# Patient Record
Sex: Female | Born: 1985 | Race: Black or African American | Hispanic: No | Marital: Single | Smoking: Never smoker
Health system: Southern US, Community
[De-identification: ages and names within clinical notes are randomized; demographics above are authoritative.]

## PROBLEM LIST (undated history)

## (undated) ENCOUNTER — Emergency Department (HOSPITAL_BASED_OUTPATIENT_CLINIC_OR_DEPARTMENT_OTHER): Admission: EM | Payer: No Typology Code available for payment source | Source: Home / Self Care

## (undated) DIAGNOSIS — K5900 Constipation, unspecified: Secondary | ICD-10-CM

## (undated) DIAGNOSIS — F429 Obsessive-compulsive disorder, unspecified: Secondary | ICD-10-CM

## (undated) DIAGNOSIS — R002 Palpitations: Secondary | ICD-10-CM

## (undated) DIAGNOSIS — F4310 Post-traumatic stress disorder, unspecified: Secondary | ICD-10-CM

## (undated) HISTORY — PX: WISDOM TOOTH EXTRACTION: SHX21

---
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
## 2008-06-06 ENCOUNTER — Emergency Department (HOSPITAL_COMMUNITY): Admission: EM | Admit: 2008-06-06 | Discharge: 2008-06-06 | Payer: Self-pay | Admitting: Emergency Medicine

## 2012-08-31 ENCOUNTER — Encounter (HOSPITAL_COMMUNITY): Payer: Self-pay

## 2012-08-31 ENCOUNTER — Emergency Department (HOSPITAL_COMMUNITY)
Admission: EM | Admit: 2012-08-31 | Discharge: 2012-09-01 | Disposition: A | Discharge status: Home / Self Care | Payer: Self-pay | Attending: Emergency Medicine | Admitting: Emergency Medicine

## 2012-08-31 DIAGNOSIS — Z79899 Other long term (current) drug therapy: Secondary | ICD-10-CM | POA: Insufficient documentation

## 2012-08-31 DIAGNOSIS — F29 Unspecified psychosis not due to a substance or known physiological condition: Secondary | ICD-10-CM

## 2012-08-31 DIAGNOSIS — Z8659 Personal history of other mental and behavioral disorders: Secondary | ICD-10-CM | POA: Insufficient documentation

## 2012-08-31 HISTORY — DX: Obsessive-compulsive disorder, unspecified: F42.9

## 2012-08-31 HISTORY — DX: Post-traumatic stress disorder, unspecified: F43.10

## 2012-08-31 LAB — COMPREHENSIVE METABOLIC PANEL
ALT: 9 U/L (ref 0–35)
AST: 18 U/L (ref 0–37)
Albumin: 4.2 g/dL (ref 3.5–5.2)
Alkaline Phosphatase: 44 U/L (ref 39–117)
BUN: 13 mg/dL (ref 6–23)
CO2: 24 mEq/L (ref 19–32)
Calcium: 9.8 mg/dL (ref 8.4–10.5)
Chloride: 100 mEq/L (ref 96–112)
Creatinine, Ser: 0.7 mg/dL (ref 0.50–1.10)
GFR calc Af Amer: >90 mL/min (ref >90)
GFR calc non Af Amer: >90 mL/min (ref >90)
Glucose, Bld: 104 mg/dL — ABNORMAL HIGH (ref 70–99)
Potassium: 4.2 mEq/L (ref 3.5–5.1)
Sodium: 135 mEq/L (ref 135–145)
Total Bilirubin: 1.2 mg/dL (ref 0.3–1.2)
Total Protein: 7.4 g/dL (ref 6.0–8.3)

## 2012-08-31 LAB — CBC WITH DIFFERENTIAL/PLATELET
Basophils Absolute: 0 10*3/uL (ref 0.0–0.1)
Basophils Relative: 1 % (ref 0–1)
Eosinophils Absolute: 0 10*3/uL (ref 0.0–0.7)
Eosinophils Relative: 0 % (ref 0–5)
HCT: 39.6 % (ref 36.0–46.0)
Hemoglobin: 13.6 g/dL (ref 12.0–15.0)
Lymphocytes Relative: 19 % (ref 12–46)
Lymphs Abs: 1.1 10*3/uL (ref 0.7–4.0)
MCH: 31.6 pg (ref 26.0–34.0)
MCHC: 34.3 g/dL (ref 30.0–36.0)
MCV: 92.1 fL (ref 78.0–100.0)
Monocytes Absolute: 0.5 10*3/uL (ref 0.1–1.0)
Monocytes Relative: 8 % (ref 3–12)
Neutro Abs: 4.3 10*3/uL (ref 1.7–7.7)
Neutrophils Relative %: 72 % (ref 43–77)
Platelets: 222 10*3/uL (ref 150–400)
RBC: 4.3 MIL/uL (ref 3.87–5.11)
RDW: 12.8 % (ref 11.5–15.5)
WBC: 5.9 10*3/uL (ref 4.0–10.5)

## 2012-08-31 LAB — RAPID URINE DRUG SCREEN, HOSP PERFORMED
Amphetamines: NOT DETECTED
Barbiturates: NOT DETECTED
Benzodiazepines: POSITIVE — AB
Cocaine: NOT DETECTED
Opiates: NOT DETECTED
Tetrahydrocannabinol: NOT DETECTED

## 2012-08-31 LAB — ETHANOL: Alcohol, Ethyl (B): <11 mg/dL (ref 0–11)

## 2012-08-31 MED ORDER — ACETAMINOPHEN 325 MG PO TABS: 650.0000 mg | ORAL_TABLET | ORAL | Status: DC | PRN

## 2012-08-31 MED ORDER — ALUM & MAG HYDROXIDE-SIMETH 200-200-20 MG/5ML PO SUSP: 30.0000 mL | ORAL | Status: DC | PRN

## 2012-08-31 MED ORDER — IBUPROFEN 600 MG PO TABS: 600.0000 mg | ORAL_TABLET | Freq: Three times a day (TID) | ORAL | Status: DC | PRN

## 2012-08-31 MED ORDER — ONDANSETRON HCL 4 MG PO TABS: 4.0000 mg | ORAL_TABLET | Freq: Three times a day (TID) | ORAL | Status: DC | PRN

## 2012-08-31 MED ORDER — LORAZEPAM 1 MG PO TABS: 1.0000 mg | ORAL_TABLET | Freq: Three times a day (TID) | ORAL | Status: DC | PRN
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
## 2012-08-31 NOTE — ED Notes (Signed)
telepsych consult faxed and called in. Awaiting call from psychiatrist

## 2012-08-31 NOTE — ED Notes (Signed)
Pt is in blue scrubs and has, along w/belongings, been wanded by security.
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
## 2012-09-01 NOTE — BHH Counselor (Signed)
Dr. Lanell Matar telepsych consult recommends discharge. Pt continues to deny to writer any SI or HI. Pt denies [facility] and no delusions noted. Pt requests info re: women's homeless shelters as she doesn't feel safe going back to her mom's house. Writer provides info re: shelters and pt Armed forces training and education officer. Pt reports she will followup with Dr. Evelene Croon as scheduled.  Evette Cristal, [place] Assessment Counselor

## 2012-09-01 NOTE — ED Provider Notes (Signed)
PSY consult  2:52 AM Dr Jacky Kindle evaluated PT and recommends OK for discharge home with outpatient follow up [facility]. Referrals provided. He reversed IVC.   Results for orders placed during the hospital encounter of 08/31/12  CBC WITH DIFFERENTIAL      Result Value Range   WBC 5.9  4.0 - 10.5 K/uL   RBC 4.30  3.87 - 5.11 MIL/uL   Hemoglobin 13.6  12.0 - 15.0 g/dL   HCT 25.9  56.3 - 87.5 %   MCV 92.1  78.0 - 100.0 fL   MCH 31.6  26.0 - 34.0 pg   MCHC 34.3  30.0 - 36.0 g/dL   RDW 64.3  32.9 - 51.8 %   Platelets 222  150 - 400 K/uL   Neutrophils Relative 72  43 - 77 %   Neutro Abs 4.3  1.7 - 7.7 K/uL   Lymphocytes Relative 19  12 - 46 %   Lymphs Abs 1.1  0.7 - 4.0 K/uL   Monocytes Relative 8  3 - 12 %   Monocytes Absolute 0.5  0.1 - 1.0 K/uL   Eosinophils Relative 0  0 - 5 %   Eosinophils Absolute 0.0  0.0 - 0.7 K/uL   Basophils Relative 1  0 - 1 %   Basophils Absolute 0.0  0.0 - 0.1 K/uL  COMPREHENSIVE METABOLIC PANEL      Result Value Range   Sodium 135  135 - 145 mEq/L   Potassium 4.2  3.5 - 5.1 mEq/L   Chloride 100  96 - 112 mEq/L   CO2 24  19 - 32 mEq/L   Glucose, Bld 104 (*) 70 - 99 mg/dL   BUN 13  6 - 23 mg/dL   Creatinine, Ser 8.41  0.50 - 1.10 mg/dL   Calcium 9.8  8.4 - 66.0 mg/dL   Total Protein 7.4  6.0 - 8.3 g/dL   Albumin 4.2  3.5 - 5.2 g/dL   AST 18  0 - 37 U/L   ALT 9  0 - 35 U/L   Alkaline Phosphatase 44  39 - 117 U/L   Total Bilirubin 1.2  0.3 - 1.2 mg/dL   GFR calc non Af Amer >90  >90 mL/min   GFR calc Af Amer >90  >90 mL/min  URINE RAPID DRUG SCREEN (HOSP PERFORMED)      Result Value Range   Opiates NONE DETECTED  NONE DETECTED   Cocaine NONE DETECTED  NONE DETECTED   Benzodiazepines POSITIVE (*) NONE DETECTED   Amphetamines NONE DETECTED  NONE DETECTED   Tetrahydrocannabinol NONE DETECTED  NONE DETECTED   Barbiturates NONE DETECTED  NONE DETECTED  ETHANOL      Result Value Range   Alcohol, Ethyl (B) <11  0 - 11 mg/dL       Sunnie Nielsen, MD 63/01/60 [phone]

## 2012-09-12 ENCOUNTER — Inpatient Hospital Stay (HOSPITAL_COMMUNITY): Payer: Self-pay

## 2012-09-12 ENCOUNTER — Inpatient Hospital Stay (HOSPITAL_COMMUNITY)
Admission: AD | Admit: 2012-09-12 | Discharge: 2012-09-13 | Disposition: A | Discharge status: Home / Self Care | Payer: Self-pay | Source: Ambulatory Visit | Attending: Emergency Medicine | Admitting: Emergency Medicine

## 2012-09-12 ENCOUNTER — Encounter (HOSPITAL_COMMUNITY): Payer: Self-pay | Admitting: *Deleted

## 2012-09-12 DIAGNOSIS — T7421XA Adult sexual abuse, confirmed, initial encounter: Secondary | ICD-10-CM | POA: Insufficient documentation

## 2012-09-12 DIAGNOSIS — Z8659 Personal history of other mental and behavioral disorders: Secondary | ICD-10-CM | POA: Insufficient documentation

## 2012-09-12 DIAGNOSIS — Z91040 Latex allergy status: Secondary | ICD-10-CM | POA: Insufficient documentation

## 2012-09-12 DIAGNOSIS — F411 Generalized anxiety disorder: Secondary | ICD-10-CM | POA: Insufficient documentation

## 2012-09-12 DIAGNOSIS — Z880 Allergy status to penicillin: Secondary | ICD-10-CM | POA: Insufficient documentation

## 2012-09-12 DIAGNOSIS — Z8679 Personal history of other diseases of the circulatory system: Secondary | ICD-10-CM | POA: Insufficient documentation

## 2012-09-12 DIAGNOSIS — F22 Delusional disorders: Secondary | ICD-10-CM | POA: Insufficient documentation

## 2012-09-12 DIAGNOSIS — Z5900 Homelessness unspecified: Secondary | ICD-10-CM | POA: Insufficient documentation

## 2012-09-12 DIAGNOSIS — Z3202 Encounter for pregnancy test, result negative: Secondary | ICD-10-CM | POA: Insufficient documentation

## 2012-09-12 DIAGNOSIS — R569 Unspecified convulsions: Secondary | ICD-10-CM | POA: Insufficient documentation

## 2012-09-12 DIAGNOSIS — Z8719 Personal history of other diseases of the digestive system: Secondary | ICD-10-CM | POA: Insufficient documentation

## 2012-09-12 DIAGNOSIS — K6289 Other specified diseases of anus and rectum: Secondary | ICD-10-CM | POA: Insufficient documentation

## 2012-09-12 DIAGNOSIS — N949 Unspecified condition associated with female genital organs and menstrual cycle: Secondary | ICD-10-CM | POA: Insufficient documentation

## 2012-09-12 DIAGNOSIS — IMO0002 Reserved for concepts with insufficient information to code with codable children: Secondary | ICD-10-CM | POA: Insufficient documentation

## 2012-09-12 HISTORY — DX: Palpitations: R00.2

## 2012-09-12 HISTORY — DX: Constipation, unspecified: K59.00

## 2012-09-12 LAB — URINALYSIS, ROUTINE W REFLEX MICROSCOPIC
Glucose, UA: NEGATIVE mg/dL
Leukocytes, UA: NEGATIVE
Nitrite: NEGATIVE
pH: 6.5 (ref 5.0–8.0)

## 2012-09-12 LAB — POCT I-STAT, CHEM 8
Calcium, Ion: 1.16 mmol/L (ref 1.12–1.23)
Creatinine, Ser: 0.7 mg/dL (ref 0.50–1.10)
Glucose, Bld: 70 mg/dL (ref 70–99)
HCT: 44 % (ref 36.0–46.0)
Hemoglobin: 15 g/dL (ref 12.0–15.0)
Potassium: 3.2 mEq/L — ABNORMAL LOW (ref 3.5–5.1)
TCO2: 29 mmol/L (ref 0–100)

## 2012-09-12 LAB — POCT PREGNANCY, URINE: Preg Test, Ur: NEGATIVE

## 2012-09-12 MED ORDER — LORAZEPAM 1 MG PO TABS
1.0000 mg | ORAL_TABLET | Freq: Three times a day (TID) | ORAL | Status: DC | PRN
  Filled 2012-09-12: qty 1

## 2012-09-12 MED ORDER — ACETAMINOPHEN 325 MG PO TABS: 650.0000 mg | ORAL_TABLET | ORAL | Status: DC | PRN

## 2012-09-12 MED ORDER — ALUM & MAG HYDROXIDE-SIMETH 200-200-20 MG/5ML PO SUSP: 30.0000 mL | ORAL | Status: DC | PRN

## 2012-09-12 MED ORDER — POTASSIUM CHLORIDE CRYS ER 20 MEQ PO TBCR
30.0000 meq | EXTENDED_RELEASE_TABLET | Freq: Two times a day (BID) | ORAL | Status: DC
  Administered 2012-09-12 – 2012-09-13 (×2): 30 meq via ORAL
  Filled 2012-09-12 (×2): qty 2

## 2012-09-12 MED ORDER — ZOLPIDEM TARTRATE 5 MG PO TABS
5.0000 mg | ORAL_TABLET | Freq: Every evening | ORAL | Status: DC | PRN
  Administered 2012-09-13: 5 mg via ORAL
  Filled 2012-09-12 (×2): qty 1

## 2012-09-12 MED ORDER — ONDANSETRON HCL 4 MG PO TABS: 4.0000 mg | ORAL_TABLET | Freq: Three times a day (TID) | ORAL | Status: DC | PRN

## 2012-09-12 MED ORDER — POLYETHYLENE GLYCOL 3350 17 G PO PACK
17.0000 g | PACK | Freq: Every day | ORAL | Status: DC
  Administered 2012-09-13: 17 g via ORAL
  Filled 2012-09-12: qty 1
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
## 2012-09-12 NOTE — ED Notes (Signed)
Labs currently being drawn. X-ray waiting in hallway. Pt changed into paper scrubs
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
## 2012-09-12 NOTE — MAU Provider Note (Signed)
Attestation of Attending Supervision of Advanced Practitioner (PA/CNM/NP): Evaluation and management procedures were performed by the Advanced Practitioner under my supervision and collaboration.  I have reviewed the Advanced Practitioner's note and chart, and I agree with the management and plan.  Vedant Shehadeh, MD, FACOG Attending Obstetrician & Gynecologist Faculty Practice, [facility]  
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
## 2012-09-13 ENCOUNTER — Inpatient Hospital Stay (HOSPITAL_COMMUNITY)
Admission: RE | Admit: 2012-09-13 | Discharge: 2012-09-17 | DRG: 885 | Disposition: A | Discharge status: Home / Self Care | Payer: No Typology Code available for payment source | Source: Intra-hospital | Attending: Psychiatry | Admitting: Psychiatry

## 2012-09-13 ENCOUNTER — Encounter (HOSPITAL_COMMUNITY): Payer: Self-pay | Admitting: Intensive Care

## 2012-09-13 DIAGNOSIS — F22 Delusional disorders: Secondary | ICD-10-CM

## 2012-09-13 DIAGNOSIS — F4310 Post-traumatic stress disorder, unspecified: Secondary | ICD-10-CM | POA: Diagnosis present

## 2012-09-13 DIAGNOSIS — F429 Obsessive-compulsive disorder, unspecified: Secondary | ICD-10-CM | POA: Diagnosis present

## 2012-09-13 LAB — RAPID URINE DRUG SCREEN, HOSP PERFORMED
Barbiturates: NOT DETECTED
Benzodiazepines: POSITIVE — AB

## 2012-09-13 MED ORDER — CHLORDIAZEPOXIDE HCL 25 MG PO CAPS
25.0000 mg | ORAL_CAPSULE | Freq: Four times a day (QID) | ORAL | Status: DC | PRN
  Administered 2012-09-14 – 2012-09-15 (×2): 25 mg via ORAL
  Filled 2012-09-13 (×2): qty 1

## 2012-09-13 MED ORDER — ACETAMINOPHEN 325 MG PO TABS
650.0000 mg | ORAL_TABLET | Freq: Four times a day (QID) | ORAL | Status: DC | PRN
  Administered 2012-09-13 – 2012-09-14 (×2): 650 mg via ORAL

## 2012-09-13 MED ORDER — ALUM & MAG HYDROXIDE-SIMETH 200-200-20 MG/5ML PO SUSP
30.0000 mL | ORAL | Status: DC | PRN
  Administered 2012-09-14 – 2012-09-16 (×4): 30 mL via ORAL

## 2012-09-13 MED ORDER — TRAZODONE HCL 50 MG PO TABS: 50.0000 mg | ORAL_TABLET | Freq: Every evening | ORAL | Status: DC | PRN

## 2012-09-13 MED ORDER — MAGNESIUM HYDROXIDE 400 MG/5ML PO SUSP: 30.0000 mL | Freq: Every day | ORAL | Status: DC | PRN

## 2012-09-13 NOTE — Progress Notes (Signed)
[facility] has seen patient and provided her with a OC application. °

## 2012-09-13 NOTE — BH Assessment (Signed)
[facility] Assessment Progress Note  Per verbal report from Marchia Bond, Assessment Counselor, at change of shift, pt was accepted to [facility] pending availability of a 400 hall bed.  At 12:45 I spoke to [place], RN, Press photographer, and ascertained that Rm 401-1 is now available.  At 12:47 I called Catha Gosselin, LCSWA and notified her.  Doylene Canning, MA Assessment Counselor 09/13/2012 @ 12:52

## 2012-09-13 NOTE — ED Notes (Signed)
Pt up asking for head of bed to be lowered. Gave urine specimen cup.

## 2012-09-13 NOTE — ED Notes (Signed)
Pt allowed to call [facility] [phone] so that she doesn't lose her spot there at the shelter. The lady said she just needs to have documentation when she returns. Pt mentioned she had a court date today.

## 2012-09-13 NOTE — ED Provider Notes (Signed)
Patient has been accepted to [facility]  Rapid, pressured, tangential speech.  BP 117/79  Pulse 102  Temp(Src) 99.4 F (37.4 C) (Oral)  Resp 16  Ht 5\' 4"  (1.626 m)  Wt 92 lb 9.6 oz (42.003 kg)  BMI 15.89 kg/m2  SpO2 100%  LMP 09/03/2012   Glynn Octave, MD 09/13/12 1313

## 2012-09-13 NOTE — ED Notes (Signed)
Pt was asking to leave immediately. Talked to EDP [place], we decided to give pt ambien as she requested prior to asking to discharge and let her be evaluated by Dr Henrene Hawking in the morning as she's accepted at [facility] pending a 400 hall bed. Pt took [place] and is agreeable to be evaluated by Dr Henrene Hawking in the morning.

## 2012-09-13 NOTE — ED Notes (Signed)
Pt up asking writer to come observe her jerking movements to tell her if they are seizures or what they are, advised writer is not qualified to make that determination or diagnosis but had just been looking at pt on monitor and didn't see any movement from her at all. Pt said it happens really quick. Enlarged her room and will monitor her closely

## 2012-09-13 NOTE — Progress Notes (Signed)
CSW met with pt to complete support paperwork. Patient states she doesn't feel she needs to be commited. CSW and pt discussed that at this time, pt has the choice to voluntarily be admitted to [facility] as recommended. Patient states that she would like to speak with her psychiatrist office, [facility]. Pt continues to discuss delusions with a chip implanted in her. Patient has pressured speech while talking with csw and doctors office. Patient agreed to go to [facility] voluntarily.   Catha Gosselin, LCSWA  [phone] .09/13/2012 1433pm

## 2012-09-13 NOTE — ED Notes (Signed)
Chlordiazepoxide #187 count secured in pharmacy even though bottle says count should be 180 and pt states she took 3 earlier today.

## 2012-09-13 NOTE — Progress Notes (Addendum)
Addendum: Patient accepted to 401-1 to Dr. Jannifer Franklin.   CSW spoke with Elijah Birk at [facility] who stated patient has been accepted pending bed availability on 400 hall.   Catha Gosselin, LCSWA  [phone] .09/13/2012 12:45pm
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
## 2012-09-13 NOTE — ED Notes (Signed)
Amy from the lab called to report result of rapid HIV test. Result is non-reactive.
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
## 2012-09-13 NOTE — Progress Notes (Signed)
Adult Psychoeducational Group Note  Date:  09/13/2012 Time:  9:03 PM  Group Topic/Focus:  Wrap-Up Group:   The focus of this group is to help patients review their daily goal of treatment and discuss progress on daily workbooks.  Participation Level:  Active  Participation Quality:  Redirectable  Affect:  Appropriate  Cognitive:  Alert  Insight: Appropriate  Engagement in Group:  Engaged  Modes of Intervention:  Discussion  Additional Comments:  Pt is new to the unit and talked about what brought her here. She is here because her mom wants her to be here. Pt is mad at her mom because she will not allow her to be an individual. Pt had to be redirected during group for always interjecting when others were talking.  Kaleen Odea R 09/13/2012, 9:03 PM

## 2012-09-14 DIAGNOSIS — F29 Unspecified psychosis not due to a substance or known physiological condition: Secondary | ICD-10-CM

## 2012-09-14 DIAGNOSIS — F4310 Post-traumatic stress disorder, unspecified: Secondary | ICD-10-CM

## 2012-09-14 MED ORDER — RISPERIDONE 0.5 MG PO TABS
0.5000 mg | ORAL_TABLET | Freq: Every day | ORAL | Status: DC
  Filled 2012-09-14 (×4): qty 1

## 2012-09-14 MED ORDER — DOCUSATE SODIUM 100 MG PO CAPS
100.0000 mg | ORAL_CAPSULE | Freq: Every day | ORAL | Status: DC
  Administered 2012-09-14 – 2012-09-15 (×2): 100 mg via ORAL
  Filled 2012-09-14 (×6): qty 1
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
## 2012-09-14 NOTE — [facility] Suicide Risk Assessment (Signed)
Suicide Risk Assessment  Admission Assessment     Nursing information obtained from:    Demographic factors:    Current Mental Status:    Loss Factors:    Historical Factors:    Risk Reduction Factors:     CLINICAL FACTORS:   Currently Psychotic  COGNITIVE FEATURES THAT CONTRIBUTE TO RISK:  Closed-mindedness Loss of executive function Polarized thinking Thought constriction (tunnel vision)    SUICIDE RISK:   Mild:  Suicidal ideation of limited frequency, intensity, duration, and specificity.  There are no identifiable plans, no associated intent, mild dysphoria and related symptoms, good self-control (both objective and subjective assessment), few other risk factors, and identifiable protective factors, including available and accessible social support.  PLAN OF CARE:  I certify that inpatient services furnished can reasonably be expected to improve the patient's condition.  Lylee Corrow T. 09/14/2012, 11:06 AM

## 2012-09-14 NOTE — Progress Notes (Signed)
The focus of this group is to help patients review their daily goal of treatment and discuss progress on daily workbooks. Pt attended the evening group session and responded to all discussion prompts. Pt reported having a good day, the highlight of which was being able to go to the courtyard to play basketball and exercise. Pt stated this was ordinarily her main way to relieve tension and stress and that her time outside today did both of these things. Pt reported feeling better mentally and physically after having gone to the courtyard. Pt also reported being ready for discharge so that she could find a job, get an apartment and work towards having a car. Pt's affect was bright and her speech rapid.

## 2012-09-14 NOTE — Progress Notes (Signed)
Patient ID: Taylor Sandoval, female   DOB: Nov 21, 1985, 27 y.o.   MRN: [id number] 09-14-12 @ 1603 nursing shift note: D: pt denied any SI/hi. She remains delusional and had several question regarding procedures on the unit. She has some general body ache and anxiety this am. A: RN answered all her concerns regarding procedures on the unit. She was administered  Librium prn for the anxiety and tylenol for the general body ache. R: the anxiety was decreased and the body ache was decreased to a 2. On her inventory sheet she wrote she slept well, appetite good, energy normal, attention good with her stating "she is not depressed at all" and "hopeful". W/d symptoms is n/a. She denies any SI. After discharge she plans to have "better nutrition, continue my exercising, enjoying life, staying happy". RN will monitor and Q 15 min ck's continue.

## 2012-09-14 NOTE — Progress Notes (Signed)
Patient ID: Taylor Sandoval, female   DOB: 08/15/1985, 27 y.o.   MRN: [id number]  D: Pt denies SI/HI/AVH. Pt is pleasant and cooperative. Pt denied Risperdal, " I don't need it". Pt stat that she feels great, " I went outside, and had a good time. Pt continues to be delusional, and is exhibiting  A: Pt was offered support and encouragement. Pt was given scheduled medications. Pt was encourage to attend groups. Q 15 minute checks were done for safety.   R:Pt attends groups and interacts well with peers and staff. Pt is taking medication. Pt has no complaints at this time.Pt not  receptive to treatment, but safety maintained on unit.
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
## 2012-09-14 NOTE — Progress Notes (Signed)
Patient ID: Taylor Sandoval, female   DOB: 1985/07/12, 27 y.o.   MRN: [id number] Psychoeducational Group Note  Date:  09/14/2012 Time:1000am  Group Topic/Focus:  Identifying Needs:   The focus of this group is to help patients identify their personal needs that have been historically problematic and identify healthy behaviors to address their needs.  Participation Level:  Active  Participation Quality:  Appropriate  Affect:  Appropriate  Cognitive:  Appropriate  Insight:  Supportive  Engagement in Group:  Supportive  Additional Comments:  Inventory and Psychoeducational group   Valente David 09/14/2012,10:24 AM

## 2012-09-15 NOTE — Progress Notes (Signed)
Patient ID: Taylor Sandoval, female   DOB: 06/23/1985, 27 y.o.   MRN: [id number] Psychoeducational Group Note  Date:  09/15/2012 Time:  1000am  Group Topic/Focus:  Making Healthy Choices:   The focus of this group is to help patients identify negative/unhealthy choices they were using prior to admission and identify positive/healthier coping strategies to replace them upon discharge.  Participation Level:  Active  Participation Quality:  Appropriate  Affect:  Appropriate  Cognitive:  Appropriate  Insight:  Supportive  Engagement in Group:  Supportive  Additional Comments:  Psychoeducational group   Taylor Sandoval 09/15/2012,10:41 AM
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
## 2012-09-15 NOTE — Progress Notes (Signed)
Patient ID: Taylor Sandoval, female   DOB: [birth year], 27 y.o.   MRN: [id number] 09-15-12@ 1136 nursing shift note: 09-15-12 nursing shift note: D: pt remains guarded and paranoid. She denied any si/hi/av presently. She is going to groups and taking her medications. She has not voiced any physical complaints and has had no complaints of pain. During the shift she got a roommate and the roommate had to be moved to another room because she, the roommate,  was so intrusive. Pt thanked staff for accommodating her. RN will monitor and Q 15 min ck's continue.

## 2012-09-15 NOTE — Progress Notes (Signed)
[facility] MD Progress Note  09/15/2012 10:48 AM Taylor Sandoval  MRN:  [id number] Subjective:  I don't have any problem. Objective: Patient seen and chart reviewed.  Patient remains very withdrawn and guarded and appears to be paranoid at times.  She denied her psychotic symptoms.  She denies that she ever threaten her mother.  She refused to start at bedtime.  Today she told that she has given medication from her psychiatrist Dr. Evelene Croon although she has not seen her in 6 weeks.  She told that she has given the diagnosis of OCD but did not mention the details.  She told she was given Prozac and Wellbutrin however she is stopped because of the side effects.  She likes Librium which is helping her sleep and anxiety.  She is not a management problem in the unit.  She is participating in the groups.  She denies any hallucination or any active or passive suicidal thoughts.  She is still very upset on her mother but did not elaborate any homicidal thoughts or plan.  She has knife at home. Diagnosis:   Axis I: Psychotic Disorder NOS Axis II: Deferred Axis III:  Past Medical History  Diagnosis Date  . OCD (obsessive compulsive disorder)   . PTSD (post-traumatic stress disorder)   . Heart palpitations   . Constipation    Axis IV: problems with primary support group Axis V: 51-60 moderate symptoms  ADL's:  Intact  Sleep: Fair  Appetite:  Fair  Suicidal Ideation:  Plan:  None Homicidal Ideation:  Plan:  none but still very upset on her mother. Intent:  vauge Means:  Patient has knifes AEB (as evidenced by):  Psychiatric Specialty Exam: Review of Systems  Constitutional: Negative.   HENT: Negative.   Respiratory: Negative.   Musculoskeletal: Negative.   Skin: Negative.   Neurological: Negative.   Psychiatric/Behavioral: The patient is nervous/anxious.        Guarded and withdrawn.    Blood pressure 115/80, pulse 120, temperature 97.5 F (36.4 C), temperature source Oral, resp. rate 18,  height 5\' 3"  (1.6 m), weight 43.092 kg (95 lb), last menstrual period 09/03/2012, SpO2 98.00%.Body mass index is 16.83 kg/(m^2).  General Appearance: Casual  Eye Contact::  Fair  Speech:  Pressured  Volume:  Decreased  Mood:  Anxious, Dysphoric, Hopeless and Irritable  Affect:  Blunt  Thought Process:  Circumstantial, Irrelevant and Loose  Orientation:  Full (Time, Place, and Person)  Thought Content:  Paranoid Ideation and Rumination  Suicidal Thoughts:  No  Homicidal Thoughts:  Yes.  without intent/plan  Memory:  Alert and oriented x3  Judgement:  Impaired  Insight:  Lacking  Psychomotor Activity:  Decreased  Concentration:  Fair  Recall:  Fair  Akathisia:  No  Handed:  Right  AIMS (if indicated):     Assets:  Physical Health  Sleep:  Number of Hours: 5   Current Medications: Current Facility-Administered Medications  Medication Dose Route Frequency Provider Last Rate Last Dose  . acetaminophen (TYLENOL) tablet 650 mg  650 mg Oral Q6H PRN Rachael Fee, MD   650 mg at 09/14/12 0744  . alum & mag hydroxide-simeth (MAALOX/MYLANTA) 200-200-20 MG/5ML suspension 30 mL  30 mL Oral Q4H PRN Rachael Fee, MD   30 mL at 09/14/12 2234  . chlordiazePOXIDE (LIBRIUM) capsule 25 mg  25 mg Oral Q6H PRN Rachael Fee, MD   25 mg at 09/14/12 0743  . docusate sodium (COLACE) capsule 100 mg  100  mg Oral Daily Cleotis Nipper, MD   100 mg at 09/15/12 0744  . magnesium hydroxide (MILK OF MAGNESIA) suspension 30 mL  30 mL Oral Daily PRN Rachael Fee, MD      . risperiDONE (RISPERDAL) tablet 0.5 mg  0.5 mg Oral QHS Cleotis Nipper, MD      . traZODone (DESYREL) tablet 50 mg  50 mg Oral QHS PRN Rachael Fee, MD        Lab Results: No results found for this or any previous visit (from the past 48 hour(s)).  Physical Findings: AIMS: Facial and Oral Movements Muscles of Facial Expression: None, normal Lips and Perioral Area: None, normal Jaw: None, normal Tongue: None, normal,Extremity  Movements Upper (arms, wrists, hands, fingers): None, normal Lower (legs, knees, ankles, toes): None, normal, Trunk Movements Neck, shoulders, hips: None, normal, Overall Severity Severity of abnormal movements (highest score from questions above): None, normal Incapacitation due to abnormal movements: None, normal Patient's awareness of abnormal movements (rate only patient's report): No Awareness, Dental Status Current problems with teeth and/or dentures?: No Does patient usually wear dentures?: No  CIWA:    COWS:     Treatment Plan Summary: Daily contact with patient to assess and evaluate symptoms and progress in treatment Medication management Need more collateral information about her baseline.  We'll contact Child psychotherapist for psychosocial information.  Plan: Patient is refusing Risperdal.  She does not believe she has any psychotic symptoms.  We'll continue to encourage to take her psychotropic medication.  Medical Decision Making Problem Points:  Established problem, stable/improving (1), Established problem, worsening (2), Review of last therapy session (1) and Review of psycho-social stressors (1) Data Points:  Decision to obtain old records (1) Review or order clinical lab tests (1) Review and summation of old records (2) Review of medication regiment & side effects (2) Review of new medications or change in dosage (2)  I certify that inpatient services furnished can reasonably be expected to improve the patient's condition.   Taylor Vetere T. 09/15/2012, 10:48 AM

## 2012-09-15 NOTE — [facility] Group Notes (Signed)
[facility] Group Notes:  (Clinical Social Work)  09/15/2012   11:15-11:45AM  Summary of Progress/Problems:  The main focus of today's process group was to listen to a variety of genres of music and to identify that different types of music provoke different responses.  The patient then was able to identify personally what was soothing for them, as well as energizing.  Handouts were used to record feelings evoked, as well as how patient can personally use this knowledge in sleep habits, with depression, and with other symptoms.  The patient expressed how each type of music made her feel, after prompting her to steer away from her intellectual thoughts about each piece of music.  Type of Therapy:  Music Therapy   Participation Level:  Active  Participation Quality:  Attentive and Sharing  Affect:  Blunted  Cognitive:  Oriented  Insight:  Developing/Improving  Engagement in Therapy:  Developing/Improving  Modes of Intervention:   Activity, Exploration  Ambrose Mantle, LCSW 09/15/2012, 12:38 PM

## 2012-09-16 DIAGNOSIS — F22 Delusional disorders: Principal | ICD-10-CM | POA: Diagnosis present
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
## 2012-09-16 NOTE — Progress Notes (Signed)
Recreation Therapy Notes  Date: 05.26.2014  Time: 9:30am  Location: [address] Dayroom   Group Topic/Focus: Self Expression   Participation Level:  Active   Participation Quality:  Appropriate   Affect:  Euthymic  Cognitive:  Oriented   Additional Comments: Activity: Me in 3; Explanation: Patients were asked to identify three characteristics they think represent them and asked to represent these characteristics in words or pictures. Patients were given the option of choosing a genre of music to listen to while completing the task requested. Patients collectively chose R&B music to listen to.   Patient arrived to group session at approximately 9:45am. Patient walked up to LRT and introduced herself and explained where she had been. Patient speech hyper-verbal. LRT explained activity to patient, patient selected paper and markers and began working on activity. Patient actively participated in group activity. Patient wrote descriptive words, as well as statements like athelete and musician. Patient sang along with songs on Internet radio station played. Patient and peer broke into song when a favorite song of theirs came one.  Marykay Lex Demetrie Borge, LRT/CTRS  Abdifatah Colquhoun L 09/16/2012 4:03 PM

## 2012-09-16 NOTE — Progress Notes (Signed)
Pt observed in the hallway looking at schedule on the wall.  She reports she has had a fairly good day and inquires about being discharged.  Informed pt that she needed to discuss that with the MD.  She also asked about Ensures, saying she was given one this morning.  She said she was eating, but that she had been losing weight prior to admission.  Told pt that this writer would speak to the PA with her request.  Pt still with delusional thinking and refusing the Risperdal that is ordered.  She does not feel she needs it.  Otherwise she is cooperative and appropriate on the unit.  Pt denies SI/HI/AV.  Support and encouragement offered.  Safety maintained with q15 minute checks.
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
## 2012-09-17 NOTE — [facility] Suicide Risk Assessment (Signed)
[facility] INPATIENT:  Family/Significant Other Suicide Prevention Education  Suicide Prevention Education:  Education Completed; No one has been identified by the patient as the family member/significant other with whom the patient will be residing, and identified as the person(s) who will aid the patient in the event of a mental health crisis (suicidal ideations/suicide attempt).  With written consent from the patient, the family member/significant other has been provided the following suicide prevention education, prior to the and/or following the discharge of the patient.  The suicide prevention education provided includes the following:  Suicide risk factors  Suicide prevention and interventions  National Suicide Hotline telephone number  [facility] assessment telephone number  [facility] Emergency Assistance 911  [facility] and/or Residential Mobile Crisis Unit telephone number  Request made of family/significant other to:  Remove weapons (e.g., guns, rifles, knives), all items previously/currently identified as safety concern.    Remove drugs/medications (over-the-counter, prescriptions, illicit drugs), all items previously/currently identified as a safety concern.  The family member/significant other verbalizes understanding of the suicide prevention education information provided.  The family member/significant other agrees to remove the items of safety concern listed above.  Seeley was not c/o SI at the time of admission, nor did she endorse SI during her stay with [place].  SPE not required.  Daryel Gerald B 09/17/2012, 10:45 AM

## 2012-09-17 NOTE — [facility] Suicide Risk Assessment (Signed)
Suicide Risk Assessment  Discharge Assessment     Demographic Factors:  Unemployed and lives in shelter  Mental Status Per Nursing Assessment::   On Admission:     Current Mental Status by Physician: patient denies suicidal ideation, intent or plan  Loss Factors: Decrease in vocational status and Financial problems/change in socioeconomic status  Historical Factors: Impulsivity  Risk Reduction Factors:   Sense of responsibility to family and Positive therapeutic relationship  Continued Clinical Symptoms:  Resolving panic disorder and delusions  Cognitive Features That Contribute To Risk:  Closed-mindedness    Suicide Risk:  Minimal: No identifiable suicidal ideation.  Patients presenting with no risk factors but with morbid ruminations; may be classified as minimal risk based on the severity of the depressive symptoms  Discharge Diagnoses:   AXIS I: PTSD.  Delusional disorder  AXIS II:  Deferred AXIS III:   Past Medical History  Diagnosis Date  . OCD (obsessive compulsive disorder)   . PTSD (post-traumatic stress disorder)   . Heart palpitations   . Constipation    AXIS IV:  other psychosocial or environmental problems and problems related to social environment AXIS V:  61-70 mild symptoms  Plan Of Care/Follow-up recommendations:  Activity:  as tolerated Diet:  healthy Tests:  routine blood test Other:  patient to keep her after care with Dr. Enzo Montgomery  Is patient on multiple antipsychotic therapies at discharge:  No   Has Patient had three or more failed trials of antipsychotic monotherapy by history:  No  Recommended Plan for Multiple Antipsychotic Therapies: N/A  Mkenzie Dotts,MD 09/17/2012, 10:11 AM
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
## 2012-09-17 NOTE — Tx Team (Signed)
  Interdisciplinary Treatment Plan Update   Date Reviewed:  09/17/2012  Time Reviewed:  8:16 AM  Progress in Treatment:   Attending groups: Yes Participating in groups: Yes Taking medication as prescribed: Yes  Tolerating medication: Yes Family/Significant other contact made: No Patient understands diagnosis: Yes As evidenced by asking for help with stabilization Discussing patient identified problems/goals with staff: Yes See initial plan Medical problems stabilized or resolved: Yes Denies suicidal/homicidal ideation: Yes  In tx team Patient has not harmed self or others: Yes  For review of initial/current patient goals, please see plan of care.  Estimated Length of Stay:  D/C today  Reason for Continuation of Hospitalization:    New Problems/Goals identified:  N/A  Discharge Plan or Barriers:   D/C today  Additional Comments:  Attendees:  Signature: Thedore Mins, MD 09/17/2012 8:16 AM   Signature: Richelle Ito, LCSW 09/17/2012 8:16 AM  Signature: Verne Spurr, PA 09/17/2012 8:16 AM  Signature: Neill Loft, RN 09/17/2012 8:16 AM  Signature: Liborio Nixon, RN 09/17/2012 8:16 AM  Signature:  09/17/2012 8:16 AM  Signature:   09/17/2012 8:16 AM  Signature:    Signature:    Signature:    Signature:    Signature:    Signature:      Scribe for Treatment Team:   [employer], LCSW  09/17/2012 8:16 AM

## 2012-09-17 NOTE — Progress Notes (Signed)
[facility] Adult Case Management Discharge Plan :  Will you be returning to the same living situation after discharge: Yes,  shelter At discharge, do you have transportation home?:Yes,  bus Do you have the ability to pay for your medications:Yes,  "friends"  Release of information consent forms completed and in the chart;  Patient's signature needed at discharge.  Patient to Follow up at: Follow-up Information   Follow up with Dr Evelene Croon.      Patient denies SI/HI:   Yes,  yes    Safety Planning and Suicide Prevention discussed:  Yes,  yes  Taylor Sandoval B 09/17/2012, 10:04 AM
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
## 2012-09-18 NOTE — Discharge Summary (Signed)
Seen and agreed. Doyne Micke, MD 

## 2012-09-19 NOTE — Progress Notes (Signed)
Patient Discharge Instructions:  After Visit Summary (AVS):   Faxed to:  09/19/12 Discharge Summary Note:   Faxed to:  09/19/12 Psychiatric Admission Assessment Note:   Faxed to:  09/19/12 Suicide Risk Assessment - Discharge Assessment:   Faxed to:  09/19/12 Faxed/Sent to the Next Level Care provider:  09/19/12 Faxed to [employer] @ [phone]  Jerelene Redden, 09/19/2012, 4:09 PM

## 2013-05-25 ENCOUNTER — Emergency Department (HOSPITAL_COMMUNITY)
Admission: EM | Admit: 2013-05-25 | Discharge: 2013-05-25 | Disposition: A | Discharge status: Home / Self Care | Payer: No Typology Code available for payment source | Attending: Emergency Medicine | Admitting: Emergency Medicine

## 2013-05-25 ENCOUNTER — Emergency Department (HOSPITAL_COMMUNITY): Payer: No Typology Code available for payment source

## 2013-05-25 ENCOUNTER — Encounter (HOSPITAL_COMMUNITY): Payer: Self-pay | Admitting: Emergency Medicine

## 2013-05-25 DIAGNOSIS — S0993XA Unspecified injury of face, initial encounter: Secondary | ICD-10-CM

## 2013-05-25 DIAGNOSIS — F429 Obsessive-compulsive disorder, unspecified: Secondary | ICD-10-CM | POA: Insufficient documentation

## 2013-05-25 DIAGNOSIS — Z91040 Latex allergy status: Secondary | ICD-10-CM | POA: Insufficient documentation

## 2013-05-25 DIAGNOSIS — S63509A Unspecified sprain of unspecified wrist, initial encounter: Secondary | ICD-10-CM | POA: Insufficient documentation

## 2013-05-25 DIAGNOSIS — Z880 Allergy status to penicillin: Secondary | ICD-10-CM | POA: Insufficient documentation

## 2013-05-25 DIAGNOSIS — Z79899 Other long term (current) drug therapy: Secondary | ICD-10-CM | POA: Insufficient documentation

## 2013-05-25 DIAGNOSIS — R609 Edema, unspecified: Secondary | ICD-10-CM | POA: Insufficient documentation

## 2013-05-25 DIAGNOSIS — S63501A Unspecified sprain of right wrist, initial encounter: Secondary | ICD-10-CM

## 2013-05-25 DIAGNOSIS — S63619A Unspecified sprain of unspecified finger, initial encounter: Secondary | ICD-10-CM

## 2013-05-25 DIAGNOSIS — F4310 Post-traumatic stress disorder, unspecified: Secondary | ICD-10-CM | POA: Insufficient documentation

## 2013-05-25 MED ORDER — HYDROCODONE-ACETAMINOPHEN 5-325 MG PO TABS
2.0000 | ORAL_TABLET | Freq: Once | ORAL | Status: AC
  Administered 2013-05-25: 2 via ORAL
  Filled 2013-05-25: qty 2
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
## 2013-05-25 NOTE — Discharge Instructions (Signed)
Apply ice in the area.   Take motrin for pain. For severe pain, take hydrocodone as prescribed by your doctor.   Use splint for comfort.   Follow up with your doctor.   Return to ER if you have severe pain, weakness, numbness.

## 2013-07-11 ENCOUNTER — Emergency Department (HOSPITAL_COMMUNITY)
Admission: EM | Admit: 2013-07-11 | Discharge: 2013-07-12 | Disposition: A | Discharge status: Home / Self Care | Payer: Self-pay | Attending: Emergency Medicine | Admitting: Emergency Medicine

## 2013-07-11 DIAGNOSIS — S139XXA Sprain of joints and ligaments of unspecified parts of neck, initial encounter: Secondary | ICD-10-CM | POA: Insufficient documentation

## 2013-07-11 DIAGNOSIS — R51 Headache: Secondary | ICD-10-CM

## 2013-07-11 DIAGNOSIS — Z8659 Personal history of other mental and behavioral disorders: Secondary | ICD-10-CM | POA: Insufficient documentation

## 2013-07-11 DIAGNOSIS — S161XXA Strain of muscle, fascia and tendon at neck level, initial encounter: Secondary | ICD-10-CM

## 2013-07-11 DIAGNOSIS — R519 Headache, unspecified: Secondary | ICD-10-CM

## 2013-07-11 DIAGNOSIS — Z91040 Latex allergy status: Secondary | ICD-10-CM | POA: Insufficient documentation

## 2013-07-11 DIAGNOSIS — S0990XA Unspecified injury of head, initial encounter: Secondary | ICD-10-CM | POA: Insufficient documentation

## 2013-07-11 DIAGNOSIS — Y9389 Activity, other specified: Secondary | ICD-10-CM | POA: Insufficient documentation

## 2013-07-11 DIAGNOSIS — Z8719 Personal history of other diseases of the digestive system: Secondary | ICD-10-CM | POA: Insufficient documentation

## 2013-07-11 DIAGNOSIS — Z880 Allergy status to penicillin: Secondary | ICD-10-CM | POA: Insufficient documentation

## 2013-07-11 DIAGNOSIS — Z3202 Encounter for pregnancy test, result negative: Secondary | ICD-10-CM | POA: Insufficient documentation

## 2013-07-11 DIAGNOSIS — Y92410 Unspecified street and highway as the place of occurrence of the external cause: Secondary | ICD-10-CM | POA: Insufficient documentation

## 2013-07-11 DIAGNOSIS — Z79899 Other long term (current) drug therapy: Secondary | ICD-10-CM | POA: Insufficient documentation

## 2013-07-12 ENCOUNTER — Emergency Department (HOSPITAL_COMMUNITY): Payer: No Typology Code available for payment source

## 2013-07-12 ENCOUNTER — Encounter (HOSPITAL_COMMUNITY): Payer: Self-pay | Admitting: Emergency Medicine

## 2013-07-12 LAB — URINALYSIS, ROUTINE W REFLEX MICROSCOPIC
Bilirubin Urine: NEGATIVE
Glucose, UA: NEGATIVE mg/dL
Hgb urine dipstick: NEGATIVE
Ketones, ur: NEGATIVE mg/dL
NITRITE: NEGATIVE
PH: 5.5 (ref 5.0–8.0)
Protein, ur: NEGATIVE mg/dL
SPECIFIC GRAVITY, URINE: 1.021 (ref 1.005–1.030)
Urobilinogen, UA: 0.2 mg/dL (ref 0.0–1.0)

## 2013-07-12 LAB — URINE MICROSCOPIC-ADD ON

## 2013-07-12 LAB — PREGNANCY, URINE: Preg Test, Ur: NEGATIVE

## 2013-07-12 MED ORDER — HYDROCODONE-ACETAMINOPHEN 5-325 MG PO TABS: 1.0000 | ORAL_TABLET | Freq: Four times a day (QID) | ORAL | Status: DC | PRN

## 2013-07-12 MED ORDER — IBUPROFEN 800 MG PO TABS
800.0000 mg | ORAL_TABLET | Freq: Once | ORAL | Status: AC
Start: 2013-07-12 — End: 2013-07-12
  Administered 2013-07-12: 800 mg via ORAL
  Filled 2013-07-12: qty 1

## 2013-07-12 MED ORDER — ONDANSETRON 8 MG PO TBDP
8.0000 mg | ORAL_TABLET | Freq: Once | ORAL | Status: AC
  Administered 2013-07-12: 8 mg via ORAL
  Filled 2013-07-12: qty 1

## 2013-07-12 MED ORDER — METHOCARBAMOL 500 MG PO TABS: 500.0000 mg | ORAL_TABLET | Freq: Two times a day (BID) | ORAL | Status: DC

## 2013-07-12 MED ORDER — IBUPROFEN 600 MG PO TABS: 600.0000 mg | ORAL_TABLET | Freq: Four times a day (QID) | ORAL | Status: DC | PRN
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
## 2013-07-12 NOTE — Discharge Instructions (Signed)
Your imaging today is negative. Recommend ibuprofen and robaxin. You may take Norco for breakthrough pain control. Recommend ice to the area 3-4 times per day for the next 48-72 hours.  Cervical Sprain A cervical sprain is an injury in the neck in which the strong, fibrous tissues (ligaments) that connect your neck bones stretch or tear. Cervical sprains can range from mild to severe. Severe cervical sprains can cause the neck vertebrae to be unstable. This can lead to damage of the spinal cord and can result in serious nervous system problems. The amount of time it takes for a cervical sprain to get better depends on the cause and extent of the injury. Most cervical sprains heal in 1 to 3 weeks. CAUSES  Severe cervical sprains may be caused by:   Contact sport injuries (such as from football, rugby, wrestling, hockey, auto racing, gymnastics, diving, martial arts, or boxing).   Motor vehicle collisions.   Whiplash injuries. This is an injury from a sudden forward-and backward whipping movement of the head and neck.  Falls.  Mild cervical sprains may be caused by:   Being in an awkward position, such as while cradling a telephone between your ear and shoulder.   Sitting in a chair that does not offer proper support.   Working at a poorly Marketing executivedesigned computer station.   Looking up or down for long periods of time.  SYMPTOMS   Pain, soreness, stiffness, or a burning sensation in the front, back, or sides of the neck. This discomfort may develop immediately after the injury or slowly, 24 hours or more after the injury.   Pain or tenderness directly in the middle of the back of the neck.   Shoulder or upper back pain.   Limited ability to move the neck.   Headache.   Dizziness.   Weakness, numbness, or tingling in the hands or arms.   Muscle spasms.   Difficulty swallowing or chewing.   Tenderness and swelling of the neck.  DIAGNOSIS  Most of the time your  health care provider can diagnose a cervical sprain by taking your history and doing a physical exam. Your health care provider will ask about previous neck injuries and any known neck problems, such as arthritis in the neck. X-rays may be taken to find out if there are any other problems, such as with the bones of the neck. Other tests, such as a CT scan or MRI, may also be needed.  TREATMENT  Treatment depends on the severity of the cervical sprain. Mild sprains can be treated with rest, keeping the neck in place (immobilization), and pain medicines. Severe cervical sprains are immediately immobilized. Further treatment is done to help with pain, muscle spasms, and other symptoms and may include:  Medicines, such as pain relievers, numbing medicines, or muscle relaxants.   Physical therapy. This may involve stretching exercises, strengthening exercises, and posture training. Exercises and improved posture can help stabilize the neck, strengthen muscles, and help stop symptoms from returning.  HOME CARE INSTRUCTIONS   Put ice on the injured area.   Put ice in a plastic bag.   Place a towel between your skin and the bag.   Leave the ice on for 15 20 minutes, 3 4 times a day.   If your injury was severe, you may have been given a cervical collar to wear. A cervical collar is a two-piece collar designed to keep your neck from moving while it heals.  Do not remove the collar  unless instructed by your health care provider.  If you have long hair, keep it outside of the collar.  Ask your health care provider before making any adjustments to your collar. Minor adjustments may be required over time to improve comfort and reduce pressure on your chin or on the back of your head.  Ifyou are allowed to remove the collar for cleaning or bathing, follow your health care provider's instructions on how to do so safely.  Keep your collar clean by wiping it with mild soap and water and drying it  completely. If the collar you have been given includes removable pads, remove them every 1 2 days and hand wash them with soap and water. Allow them to air dry. They should be completely dry before you wear them in the collar.  If you are allowed to remove the collar for cleaning and bathing, wash and dry the skin of your neck. Check your skin for irritation or sores. If you see any, tell your health care provider.  Do not drive while wearing the collar.   Only take over-the-counter or prescription medicines for pain, discomfort, or fever as directed by your health care provider.   Keep all follow-up appointments as directed by your health care provider.   Keep all physical therapy appointments as directed by your health care provider.   Make any needed adjustments to your workstation to promote good posture.   Avoid positions and activities that make your symptoms worse.   Warm up and stretch before being active to help prevent problems.  SEEK MEDICAL CARE IF:   Your pain is not controlled with medicine.   You are unable to decrease your pain medicine over time as planned.   Your activity level is not improving as expected.  SEEK IMMEDIATE MEDICAL CARE IF:   You develop any bleeding.  You develop stomach upset.  You have signs of an allergic reaction to your medicine.   Your symptoms get worse.   You develop new, unexplained symptoms.   You have numbness, tingling, weakness, or paralysis in any part of your body.  MAKE SURE YOU:   Understand these instructions.  Will watch your condition.  Will get help right away if you are not doing well or get worse. Document Released: 02/05/2007 Document Revised: 01/29/2013 Document Reviewed: 10/16/2012 [facility] Patient Information 2014 [place], [place].
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
## 2013-07-17 NOTE — ED Provider Notes (Signed)
Medical screening examination/treatment/procedure(s) were performed by non-physician practitioner and as supervising physician I was immediately available for consultation/collaboration.   Dione Boozeavid Seda Kronberg, MD 07/17/13 [phone]

## 2013-07-24 ENCOUNTER — Ambulatory Visit: Payer: Self-pay | Attending: Family Medicine | Admitting: Physical Therapy

## 2013-07-24 DIAGNOSIS — M2560 Stiffness of unspecified joint, not elsewhere classified: Secondary | ICD-10-CM | POA: Insufficient documentation

## 2013-07-24 DIAGNOSIS — R51 Headache: Secondary | ICD-10-CM | POA: Insufficient documentation

## 2013-07-24 DIAGNOSIS — IMO0001 Reserved for inherently not codable concepts without codable children: Secondary | ICD-10-CM | POA: Insufficient documentation

## 2013-07-24 DIAGNOSIS — M542 Cervicalgia: Secondary | ICD-10-CM | POA: Insufficient documentation

## 2013-07-24 DIAGNOSIS — R293 Abnormal posture: Secondary | ICD-10-CM | POA: Insufficient documentation

## 2013-07-28 ENCOUNTER — Ambulatory Visit: Payer: Self-pay | Admitting: Physical Therapy

## 2013-07-31 ENCOUNTER — Ambulatory Visit: Payer: Self-pay | Admitting: Rehabilitation

## 2013-08-04 ENCOUNTER — Ambulatory Visit: Payer: Self-pay | Admitting: Rehabilitation

## 2013-08-06 ENCOUNTER — Ambulatory Visit: Payer: Self-pay | Admitting: Rehabilitation

## 2013-08-11 ENCOUNTER — Ambulatory Visit: Payer: Self-pay | Admitting: Rehabilitation

## 2013-08-13 ENCOUNTER — Ambulatory Visit: Payer: Self-pay | Admitting: Rehabilitation

## 2013-08-18 ENCOUNTER — Ambulatory Visit: Payer: No Typology Code available for payment source | Admitting: Rehabilitation

## 2013-08-20 ENCOUNTER — Ambulatory Visit: Payer: No Typology Code available for payment source | Admitting: Rehabilitation

## 2013-08-26 ENCOUNTER — Ambulatory Visit: Payer: Self-pay | Attending: Family Medicine | Admitting: Physical Therapy

## 2013-08-26 DIAGNOSIS — Z5189 Encounter for other specified aftercare: Secondary | ICD-10-CM | POA: Insufficient documentation

## 2013-08-26 DIAGNOSIS — R293 Abnormal posture: Secondary | ICD-10-CM | POA: Insufficient documentation

## 2013-08-26 DIAGNOSIS — R51 Headache: Secondary | ICD-10-CM | POA: Insufficient documentation

## 2013-08-26 DIAGNOSIS — M2560 Stiffness of unspecified joint, not elsewhere classified: Secondary | ICD-10-CM | POA: Insufficient documentation

## 2013-08-26 DIAGNOSIS — M542 Cervicalgia: Secondary | ICD-10-CM | POA: Insufficient documentation

## 2013-08-28 ENCOUNTER — Ambulatory Visit: Payer: Self-pay | Admitting: Physical Therapy

## 2013-09-02 ENCOUNTER — Ambulatory Visit: Payer: Self-pay | Admitting: Rehabilitation

## 2013-09-04 ENCOUNTER — Ambulatory Visit: Payer: No Typology Code available for payment source | Admitting: Rehabilitation

## 2013-09-08 ENCOUNTER — Ambulatory Visit: Payer: No Typology Code available for payment source | Admitting: Physical Therapy

## 2013-09-22 ENCOUNTER — Ambulatory Visit: Payer: No Typology Code available for payment source | Attending: Family Medicine | Admitting: Physical Therapy

## 2013-09-22 DIAGNOSIS — M2560 Stiffness of unspecified joint, not elsewhere classified: Secondary | ICD-10-CM | POA: Insufficient documentation

## 2013-09-22 DIAGNOSIS — M542 Cervicalgia: Secondary | ICD-10-CM | POA: Insufficient documentation

## 2013-09-22 DIAGNOSIS — R293 Abnormal posture: Secondary | ICD-10-CM | POA: Insufficient documentation

## 2013-09-22 DIAGNOSIS — Z5189 Encounter for other specified aftercare: Secondary | ICD-10-CM | POA: Insufficient documentation

## 2013-09-22 DIAGNOSIS — R51 Headache: Secondary | ICD-10-CM | POA: Insufficient documentation

## 2013-09-29 ENCOUNTER — Ambulatory Visit: Payer: No Typology Code available for payment source | Admitting: Physical Therapy

## 2013-10-09 ENCOUNTER — Encounter (HOSPITAL_COMMUNITY): Payer: Self-pay | Admitting: Emergency Medicine

## 2013-10-09 ENCOUNTER — Emergency Department (HOSPITAL_COMMUNITY)
Admission: EM | Admit: 2013-10-09 | Discharge: 2013-10-10 | Disposition: A | Discharge status: Home / Self Care | Payer: No Typology Code available for payment source | Attending: Emergency Medicine | Admitting: Emergency Medicine

## 2013-10-09 DIAGNOSIS — R451 Restlessness and agitation: Secondary | ICD-10-CM

## 2013-10-09 DIAGNOSIS — F911 Conduct disorder, childhood-onset type: Secondary | ICD-10-CM | POA: Insufficient documentation

## 2013-10-09 DIAGNOSIS — F430 Acute stress reaction: Secondary | ICD-10-CM

## 2013-10-09 DIAGNOSIS — F4310 Post-traumatic stress disorder, unspecified: Secondary | ICD-10-CM | POA: Insufficient documentation

## 2013-10-09 DIAGNOSIS — R4689 Other symptoms and signs involving appearance and behavior: Secondary | ICD-10-CM

## 2013-10-09 DIAGNOSIS — F429 Obsessive-compulsive disorder, unspecified: Secondary | ICD-10-CM | POA: Insufficient documentation

## 2013-10-09 LAB — CBC WITH DIFFERENTIAL/PLATELET
Basophils Absolute: 0 10*3/uL (ref 0.0–0.1)
Basophils Relative: 0 % (ref 0–1)
EOS PCT: 1 % (ref 0–5)
Eosinophils Absolute: 0.1 10*3/uL (ref 0.0–0.7)
HEMATOCRIT: 40.2 % (ref 36.0–46.0)
HEMOGLOBIN: 13.7 g/dL (ref 12.0–15.0)
LYMPHS ABS: 0.9 10*3/uL (ref 0.7–4.0)
LYMPHS PCT: 9 % — AB (ref 12–46)
MCH: 31.8 pg (ref 26.0–34.0)
MCHC: 34.1 g/dL (ref 30.0–36.0)
MCV: 93.3 fL (ref 78.0–100.0)
MONOS PCT: 10 % (ref 3–12)
Monocytes Absolute: 1 10*3/uL (ref 0.1–1.0)
Neutro Abs: 7.9 10*3/uL — ABNORMAL HIGH (ref 1.7–7.7)
Neutrophils Relative %: 80 % — ABNORMAL HIGH (ref 43–77)
Platelets: 189 10*3/uL (ref 150–400)
RBC: 4.31 MIL/uL (ref 3.87–5.11)
RDW: 12.8 % (ref 11.5–15.5)
WBC: 9.8 10*3/uL (ref 4.0–10.5)

## 2013-10-09 LAB — COMPREHENSIVE METABOLIC PANEL
ALT: 30 U/L (ref 0–35)
AST: 60 U/L — ABNORMAL HIGH (ref 0–37)
Albumin: 4.2 g/dL (ref 3.5–5.2)
Alkaline Phosphatase: 59 U/L (ref 39–117)
BUN: 17 mg/dL (ref 6–23)
CALCIUM: 9.5 mg/dL (ref 8.4–10.5)
CO2: 19 meq/L (ref 19–32)
Chloride: 98 mEq/L (ref 96–112)
Creatinine, Ser: 0.96 mg/dL (ref 0.50–1.10)
GFR calc Af Amer: >90 mL/min (ref >90)
GFR, EST NON AFRICAN AMERICAN: 79 mL/min — AB (ref >90)
GLUCOSE: 104 mg/dL — AB (ref 70–99)
Potassium: 3.6 mEq/L — ABNORMAL LOW (ref 3.7–5.3)
Sodium: 139 mEq/L (ref 137–147)
Total Bilirubin: 2 mg/dL — ABNORMAL HIGH (ref 0.3–1.2)
Total Protein: 7.8 g/dL (ref 6.0–8.3)

## 2013-10-09 LAB — ACETAMINOPHEN LEVEL

## 2013-10-09 LAB — CBG MONITORING, ED
Glucose-Capillary: 145 mg/dL — ABNORMAL HIGH (ref 70–99)
Glucose-Capillary: 73 mg/dL (ref 70–99)

## 2013-10-09 LAB — URINALYSIS, ROUTINE W REFLEX MICROSCOPIC
BILIRUBIN URINE: NEGATIVE
Glucose, UA: NEGATIVE mg/dL
Hgb urine dipstick: NEGATIVE
Ketones, ur: >80 mg/dL — AB
Leukocytes, UA: NEGATIVE
Nitrite: NEGATIVE
PH: 5.5 (ref 5.0–8.0)
Protein, ur: NEGATIVE mg/dL
Specific Gravity, Urine: 1.015 (ref 1.005–1.030)
Urobilinogen, UA: 0.2 mg/dL (ref 0.0–1.0)

## 2013-10-09 LAB — RAPID URINE DRUG SCREEN, HOSP PERFORMED
Amphetamines: NOT DETECTED
Barbiturates: NOT DETECTED
Benzodiazepines: NOT DETECTED
COCAINE: NOT DETECTED
OPIATES: NOT DETECTED
Tetrahydrocannabinol: NOT DETECTED

## 2013-10-09 LAB — CK
CK TOTAL: 1117 U/L — AB (ref 7–177)
Total CK: 1120 U/L — ABNORMAL HIGH (ref 7–177)

## 2013-10-09 LAB — SALICYLATE LEVEL

## 2013-10-09 LAB — PREGNANCY, URINE: Preg Test, Ur: NEGATIVE

## 2013-10-09 LAB — ETHANOL: Alcohol, Ethyl (B): <11 mg/dL (ref 0–11)

## 2013-10-09 MED ORDER — HALOPERIDOL LACTATE 5 MG/ML IJ SOLN
5.0000 mg | Freq: Once | INTRAMUSCULAR | Status: AC
  Administered 2013-10-09: 5 mg via INTRAMUSCULAR

## 2013-10-09 MED ORDER — LORAZEPAM 1 MG PO TABS: 1.0000 mg | ORAL_TABLET | Freq: Three times a day (TID) | ORAL | Status: DC | PRN

## 2013-10-09 MED ORDER — ALUM & MAG HYDROXIDE-SIMETH 200-200-20 MG/5ML PO SUSP
30.0000 mL | ORAL | Status: DC | PRN
Start: 2013-10-09 — End: 2013-10-10

## 2013-10-09 MED ORDER — SODIUM CHLORIDE 0.9 % IV BOLUS (SEPSIS)
1000.0000 mL | Freq: Once | INTRAVENOUS | Status: AC
  Administered 2013-10-09: 1000 mL via INTRAVENOUS

## 2013-10-09 MED ORDER — IBUPROFEN 200 MG PO TABS: 600.0000 mg | ORAL_TABLET | Freq: Three times a day (TID) | ORAL | Status: DC | PRN

## 2013-10-09 MED ORDER — ZOLPIDEM TARTRATE 5 MG PO TABS: 5.0000 mg | ORAL_TABLET | Freq: Every evening | ORAL | Status: DC | PRN

## 2013-10-09 MED ORDER — HYOSCYAMINE SULFATE 0.125 MG PO TABS
0.1250 mg | ORAL_TABLET | ORAL | Status: DC | PRN
  Filled 2013-10-09: qty 1

## 2013-10-09 MED ORDER — ONDANSETRON HCL 4 MG PO TABS: 4.0000 mg | ORAL_TABLET | Freq: Three times a day (TID) | ORAL | Status: DC | PRN

## 2013-10-09 MED ORDER — LORAZEPAM 2 MG/ML IJ SOLN
2.0000 mg | Freq: Once | INTRAMUSCULAR | Status: AC
  Administered 2013-10-09: 2 mg via INTRAMUSCULAR

## 2013-10-09 MED ORDER — ACETAMINOPHEN 325 MG PO TABS: 650.0000 mg | ORAL_TABLET | ORAL | Status: DC | PRN

## 2013-10-09 MED ORDER — LORAZEPAM 2 MG/ML IJ SOLN
INTRAMUSCULAR | Status: AC
  Filled 2013-10-09: qty 1

## 2013-10-09 MED ORDER — NICOTINE 21 MG/24HR TD PT24: 21.0000 mg | MEDICATED_PATCH | Freq: Every day | TRANSDERMAL | Status: DC

## 2013-10-09 NOTE — ED Notes (Signed)
Pt's pupils reactive; nonverbal; Staff securing restraints; attempting to remove cuffs. Pt still grabbing.

## 2013-10-09 NOTE — ED Notes (Signed)
Patient meeting requirements for restraint removal, restraints removed, patient agrees to not repeat further violent or self-destructive behavior.  Patient is resting calmly in bed, will continue to assess/monitor.

## 2013-10-09 NOTE — ED Notes (Signed)
[id number] Expected date: 10/09/13 Expected time:  Means of arrival:  Comments: [facility] from [facility]

## 2013-10-09 NOTE — ED Notes (Addendum)
Handcuffs removed; 4 point restraints be secured. Pt now saying "she just wants to go. Why are we doing this? [place]" Rn explained situation to pt. Pt continuing to try to get out of bed. Pt screaming.
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
## 2013-10-09 NOTE — ED Notes (Signed)
Pt was placed in paper scrubs
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
## 2013-10-09 NOTE — ED Notes (Signed)
Mother at bedside.

## 2013-10-09 NOTE — ED Notes (Addendum)
Posey waist belt applied. RN can fit hand under. Seizure pads applied to rails.

## 2013-10-09 NOTE — ED Notes (Signed)
Checked patient blood sugar at 10:39am it was 145 notified Dr. Elesa MassedWard of blood sgar

## 2013-10-09 NOTE — ED Notes (Signed)
Pt continuing to try to sit up in bed and get out of bed; Staff continuing to try to verbally deescalate and explain parameters for her to get out of restriants.

## 2013-10-09 NOTE — ED Notes (Signed)
PT FATHER HAS CALLED AND CHECKED ON PT. STATES PT HAS BEEN OFF HER MEDS FOR 2-3 WEEKS. STATES HE THINKS THAT HIS OPEN HEART SURGERY IS A TRIGGER FOR PT BEHVIOR

## 2013-10-09 NOTE — ED Notes (Signed)
IVC papers rescinded by Dr Loretha StaplerWofford.  Pending final dispo.

## 2013-10-09 NOTE — ED Notes (Signed)
Pt ambulatory w/ [employer] to room 43.  Pt reports her glasses and sneakers are missing.  mHt contacted [place] and they do not have them.

## 2013-10-09 NOTE — ED Notes (Signed)
Pt has been asked to give urine. Pt states that she will use the bedpan but didn't use it. When pt was told that she may have to get an in and out cath, she refused it but asked if she could have water to help her to urinate. Pt has not yet urinated. It has been 20 mins since she has received the water to drink and she drank 1 full cup of water and 2 sips out of a second cup.

## 2013-10-09 NOTE — ED Notes (Signed)
Right ankle restrain removed, monitoring patient behavior for continued compliance

## 2013-10-09 NOTE — ED Notes (Signed)
Water given via straw. Pt sitting up.

## 2013-10-09 NOTE — ED Notes (Signed)
Belongings handed to [employer] upon transport

## 2013-10-09 NOTE — ED Notes (Signed)
Patient meeting criteria for restraint removal, right wrist restraint removed

## 2013-10-09 NOTE — ED Notes (Signed)
Pt less physically aggressive but still demanding to leave. Hands are reddened from pulling on restraints; pulses intact; restraints loosened.

## 2013-10-09 NOTE — ED Notes (Signed)
Received pt laying on stretcher, reports she was taking librium as needed, denies SI, HI or AV  Hallucinations.  Denies SI in the past, denies street drugs or alcohol use.  Pt very sleepy, had [facility] & [facility].  Pt assessed by TTS Berna SpareMarcus.

## 2013-10-09 NOTE — ED Notes (Signed)
Pt being eval by TTS Marcus at present. 

## 2013-10-09 NOTE — ED Notes (Addendum)
Sleeping soundly, arousable, but does not answer questions.  TTS aware and will attempt assessment later
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
## 2013-10-09 NOTE — ED Notes (Addendum)
Pt's mother into see.  Pt sleeping, but responds minimally to mother.  Mom reports that she lives with her father, and that they did not realized that she has been off of her meds x3 weeks.  Mom reports that she was in holding her fathers hand and when her brother came in to get the clothes she began talking about loving him, went and hugged the nurse talking about love.  Father is scheduled for open heart surgery in the AM.  Pt has a hx of depression, OCD, and anxiety per mom and sees Dr Westley ChandlerKarr.  Mom"s phone number--Louise Earlene PlaterDavis [phone].  Father's room number [id number] room 37.  Mom request that if she is dc'd that she or the patients brother be contacted to pick her up.  Mom brought the patients sneakers and thinks that the glasses are in her father's room.  Requested that she or the brother get the glasses and bring them here for the patient.

## 2013-10-09 NOTE — ED Notes (Signed)
Pt agreeable with transfer, when asked to sign transfer form states she cannot right now. When asked again about agreement to go to [facility] patient states she is in agreement with plan "but something is keeping me hands down". Pt with normal movement noted. Transfer consent signed with second RN.

## 2013-10-09 NOTE — ED Notes (Signed)
Patient does not want in and out cath at this time, agrees to use bedside commode when she has the urge to urinate, given fluids as orders.

## 2013-10-10 NOTE — ED Notes (Signed)
Pt ambulatory to BR, gait steady.  Called brother to transport home.
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
## 2013-10-10 NOTE — ED Provider Notes (Addendum)
Care assumed when [facility] for direct TTS evaluation.  I discussed her case with Berna SpareMarcus from TTS who reported that she was calm, appropriate, denying SI, HI, or hallucinations.  He also reported that he spoke with her father and mother who both felt comfortable caring for her at home.  On my eval, pt remains drowsy (thought to be secondary to Ativan and Haldol), but she denied SI/HI/AVS.  Her history sounds most consistent with a grief/stress reaction.  She no longer meets criteria for involuntary commitment and this has been rescinded.  Plan DC home with family when she is less drowsy.    Clinical Impression: 1. Stress reaction   2. Agitation requiring sedation protocol   3. Aggressive behavior       Merrie RoofJohn David Wofford III, MD 10/10/13 0012  Merrie RoofJohn David Wofford III, MD 10/10/13 [phone]

## 2013-10-10 NOTE — Discharge Instructions (Signed)
Stress Stress-related medical problems are becoming increasingly common. The body has a built-in physical response to stressful situations. Faced with pressure, challenge or danger, we need to react quickly. Our bodies release hormones such as cortisol and adrenaline to help do this. These hormones are part of the "fight or flight" response and affect the metabolic rate, heart rate and blood pressure, resulting in a heightened, stressed state that prepares the body for optimum performance in dealing with a stressful situation. It is likely that early man required these mechanisms to stay alive, but usually modern stresses do not call for this, and the same hormones released in today's world can damage health and reduce coping ability. CAUSES  Pressure to perform at work, at school or in sports.  Threats of physical violence.  Money worries.  Arguments.  Family conflicts.  Divorce or separation from significant other.  Bereavement.  New job or unemployment.  Changes in location.  Alcohol or drug abuse. SOMETIMES, THERE IS NO PARTICULAR REASON FOR DEVELOPING STRESS. Almost all people are at risk of being stressed at some time in their lives. It is important to know that some stress is temporary and some is long term.  Temporary stress will go away when a situation is resolved. Most people can cope with short periods of stress, and it can often be relieved by relaxing, taking a walk or getting any type of exercise, chatting through issues with friends, or having a good night's sleep.  Chronic (long-term, continuous) stress is much harder to deal with. It can be psychologically and emotionally damaging. It can be harmful both for an individual and for friends and family. SYMPTOMS Everyone reacts to stress differently. There are some common effects that help [place] recognize it. In times of extreme stress, people may:  Shake uncontrollably.  Breathe faster and deeper than normal  (hyperventilate).  Vomit.  For people with asthma, stress can trigger an attack.  For some people, stress may trigger migraine headaches, ulcers, and body pain. PHYSICAL EFFECTS OF STRESS MAY INCLUDE:  Loss of energy.  Skin problems.  Aches and pains resulting from tense muscles, including neck ache, backache and tension headaches.  Increased pain from arthritis and other conditions.  Irregular heart beat (palpitations).  Periods of irritability or anger.  Apathy or depression.  Anxiety (feeling uptight or worrying).  Unusual behavior.  Loss of appetite.  Comfort eating.  Lack of concentration.  Loss of, or decreased, sex-drive.  Increased smoking, drinking, or recreational drug use.  For women, missed periods.  Ulcers, joint pain, and muscle pain. Post-traumatic stress is the stress caused by any serious accident, strong emotional damage, or extremely difficult or violent experience such as rape or war. Post-traumatic stress victims can experience mixtures of emotions such as fear, shame, depression, guilt or anger. It may include recurrent memories or images that may be haunting. These feelings can last for weeks, months or even years after the traumatic event that triggered them. Specialized treatment, possibly with medicines and psychological therapies, is available. If stress is causing physical symptoms, severe distress or making it difficult for you to function as normal, it is worth seeing your caregiver. It is important to remember that although stress is a usual part of life, extreme or prolonged stress can lead to other illnesses that will need treatment. It is better to visit a doctor sooner rather than later. Stress has been linked to the development of high blood pressure and heart disease, as well as insomnia and depression.  There is no diagnostic test for stress since everyone reacts to it differently. But a caregiver will be able to spot the physical  symptoms, such as:  Headaches.  Shingles.  Ulcers. Emotional distress such as intense worry, low mood or irritability should be detected when the doctor asks pertinent questions to identify any underlying problems that might be the cause. In case there are physical reasons for the symptoms, the doctor may also want to do some tests to exclude certain conditions. If you feel that you are suffering from stress, try to identify the aspects of your life that are causing it. Sometimes you may not be able to change or avoid them, but even a small change can have a positive ripple effect. A simple lifestyle change can make all the difference. STRATEGIES THAT CAN HELP DEAL WITH STRESS:  Delegating or sharing responsibilities.  Avoiding confrontations.  Learning to be more assertive.  Regular exercise.  Avoid using alcohol or street drugs to cope.  Eating a healthy, balanced diet, rich in fruit and vegetables and proteins.  Finding humor or absurdity in stressful situations.  Never taking on more than you know you can handle comfortably.  Organizing your time better to get as much done as possible.  Talking to friends or family and sharing your thoughts and fears.  Listening to music or relaxation tapes.  Relaxation techniques like deep breathing, meditation, and yoga.  Tensing and then relaxing your muscles, starting at the toes and working up to the head and neck. If you think that you would benefit from help, either in identifying the things that are causing your stress or in learning techniques to help you relax, see a caregiver who is capable of helping you with this. Rather than relying on medications, it is usually better to try and identify the things in your life that are causing stress and try to deal with them. There are many techniques of managing stress including counseling, psychotherapy, aromatherapy, yoga, and exercise. Your caregiver can help you determine what is best  for you. Document Released: 07/01/2002 Document Revised: 04/15/2013 Document Reviewed: 05/28/2007 [facility] Patient Information 2015 [place], [place]. This information is not intended to replace advice given to you by your health care provider. Make sure you discuss any questions you have with your health care provider.  Stress and Stress Management Stress is a normal reaction to life events. It is what you feel when life demands more than you are used to or more than you can handle. Some stress can be useful. For example, the stress reaction can help you catch the last bus of the day, study for a test, or meet a deadline at work. But stress that occurs too often or for too long can cause problems. It can affect your emotional health and interfere with relationships and normal daily activities. Too much stress can weaken your immune system and increase your risk for physical illness. If you already have a medical problem, stress can make it worse. CAUSES  All sorts of life events may cause stress. An event that causes stress for one person may not be stressful for another person. Major life events commonly cause stress. These may be positive or negative. Examples include losing your job, moving into a new home, getting married, having a baby, or losing a loved one. Less obvious life events may also cause stress, especially if they occur day after day or in combination. Examples include working long hours, driving in traffic, caring for children,  being in debt, or being in a difficult relationship. SIGNS AND SYMPTOMS Stress may cause emotional symptoms including, the following:  Anxiety--This is feeling worried, afraid, on edge, overwhelmed, or out of control.  Anger--This is feeling irritated or impatient.  Depression--This is feeling sad, down, helpless, or guilty.  Difficulty focusing, remembering, or making decisions. Stress may cause physical symptoms, including the following:   Aches and  pains--These may affect your head, neck, back, stomach, or other areas of your body.  Tight muscles or clenched jaw.  Low energy or trouble sleeping. Stress may cause unhealthy behaviors, including the following:   Eating to feel better (overeating) or skipping meals.  Sleeping too little, too much, or both.  Working too much or putting off tasks (procrastination).  Smoking, drinking alcohol, or using drugs to feel better. DIAGNOSIS  Stress is diagnosed through an assessment by your health care provider. Your health care provider will ask questions about your symptoms and any stressful life events.Your health care provider will also ask about your medical history and may order blood tests or other tests. Certain medical conditions and medicine can cause physical symptoms similar to stress. Mental illness can cause emotional symptoms and unhealthy behaviors similar to stress. Your health care provider may refer you to a mental health professional for further evaluation.  TREATMENT  Stress management is the recommended treatment for stress.The goals of stress management are reducing stressful life events and coping with stress in healthy ways.  Techniques for reducing stressful life events include the following:  Stress identification--Self-monitor for stress and identify what causes stress for you. These skills may help you to avoid some stressful events.  Time management--Set your priorities, keep a calendar of events, and learn to say "no." These tools can help you avoid making too many commitments. Techniques for coping with stress include the following:  Rethinking the problem--Try to think realistically about stressful events rather than ignoring them or overreacting. Try to find the positives in a stressful situation rather than focusing on the negatives.  Exercise--Physical exercise can release both physical and emotional tension. The key is to find a form of exercise you enjoy  and do it regularly.  Relaxation techniques. These relax the body and mind. Examples include yoga, meditation, tai chi, biofeedback, deep breathing, progressive muscle relaxation, listening to music, being out in nature, journaling, and other hobbies. Again, the key is to find one or more that you enjoy and can do regularly.  Healthy lifestyle--Eat a balanced diet, get plenty of sleep, and do not smoke. Avoid using alcohol or drugs to relax.  Strong support network--Spend time with family, friends, or other people you enjoy being around.Express your feelings and talk things over with someone you trust. Counseling or talktherapy with a mental health professional may be helpful if you are having difficulty managing stress on your own. Medicine is typically not recommended for the treatment of stress.Talk to your health care provider if you think you need medicine for symptoms of stress. HOME CARE INSTRUCTIONS:  Keep all follow up appointments with your health care provider.  Only take any prescribed medicines as directed by your health care provider.  Talk to your health care provider before starting any new prescription or over-the-counter medicines. SEEK MEDICAL CARE IF:  Your symptoms get worse or you start having new symptoms.  You feel overwhelmed by your problems and can no longer manage them on your own. SEEK IMMEDIATE MEDICAL CARE IF:  You feel like hurting yourself or someone  else. Document Released: 10/04/2000 Document Revised: 04/15/2013 Document Reviewed: 12/03/2012 [facility] Patient Information 2015 [place], [place]. This information is not intended to replace advice given to you by your health care provider. Make sure you discuss any questions you have with your health care provider.   Emergency Department Resource Guide 1) Find a Doctor and Pay Out of Pocket Although you won't have to find out who is covered by your insurance plan, it is a good idea to ask around and get  recommendations. You will then need to call the office and see if the doctor you have chosen will accept you as a new patient and what types of options they offer for patients who are self-pay. Some doctors offer discounts or will set up payment plans for their patients who do not have insurance, but you will need to ask so you aren't surprised when you get to your appointment.  2) Contact Your Local Health Department Not all health departments have doctors that can see patients for sick visits, but many do, so it is worth a call to see if yours does. If you don't know where your local health department is, you can check in your phone book. The CDC also has a tool to help you locate your state's health department, and many state websites also have listings of all of their local health departments.  3) Find a [facility] If your illness is not likely to be very severe or complicated, you may want to try a walk in clinic. These are popping up all over the country in pharmacies, drugstores, and shopping centers. They're usually staffed by nurse practitioners or physician assistants that have been trained to treat common illnesses and complaints. They're usually fairly quick and inexpensive. However, if you have serious medical issues or chronic medical problems, these are probably not your best option.  No Primary Care Doctor: - Call Health Connect at  [phone] - they can help you locate a primary care doctor that  accepts your insurance, provides certain services, etc. - Physician Referral Service- [phone]  Chronic Pain Problems: Organization         Address  Phone   Notes  [facility]  [phone] Patients need to be referred by their primary care doctor.   Medication Assistance: Organization         Address  Phone   Notes  [facility] Medication [facility] [address] [place], [place] [address] [phone] --Must be a resident of  [facility] -- Must have NO insurance coverage whatsoever (no Medicaid/ Medicare, etc.) -- The pt. MUST have a primary care doctor that directs their care regularly and follows them in the community   [facility]ist  [phone]   [employer]  [phone]    Agencies that provide inexpensive medical care: Organization         Address  Phone   Notes  [place]  [phone]   [facility]    [phone]   [facility] [place], [place] [address] [phone]   [place] [address] [place] [phone]   Planned Parenthood    [phone]   [facility]    [phone]   [place] [address], [place] Phone:  [phone], Fax:  [phone] Hours of Operation:  9 am - 6 pm,  M-F.  Also accepts Medicaid/Medicare and self-pay.  [facility] for [place], [address], [place] Phone: [phone], Fax: [phone]. Hours of Operation:  8:30 am - 5:30 pm, M-F.  Also accepts Medicaid and self-pay.  [facility] [address], [place] Phone: [phone]   [place], [place], [place] [phone], Ext. [phone] Mondays & Thursdays: 7-9 AM.  First 15 patients are seen on a first come, first serve basis.    [place] Providers:  Organization         Address  Phone   Notes  [facility] [address], [place] [phone] Also accepts self-pay patients.  [facility] [address], [place]  [phone]   [place], [address], [place] [phone]   [facility] [address], [place] [phone]   Lucianne Lei [address], [place]   [phone] Only accepts [place] Access  [place] patients after they have their name applied to their card.   Self-Pay (no insurance) in [facility]:  Organization         Address  Phone   Notes  Sickle Cell Patients, [facility] [place] [phone]   [facility] [place] [phone]   [facility] [place]  [place], [place], [place] [phone]   [facility]/Dr. Osei-Bonsu  [address] [place] or [address], [place] [phone] Phone number for both [place] and [place] locations is the same.  [facility] [address] [place] [phone]   [facility] [address], [place] or [address] [phone]   [facility] [address] [place] [phone], phone; [phone], fax Sees patients 1st and 3rd Saturday of every month.  Must not qualify for public or private insurance (i.e. Medicaid, Medicare, [place] Health Choice, Veterans' Benefits)  Household income should be no more than 200% of the poverty level The clinic cannot treat you if you are pregnant or think you are pregnant  Sexually transmitted diseases are not treated at the clinic.    Dental Care: Organization         Address  Phone  Notes  [facility] Department of [facility] [place] [phone] Accepts children up to age 24 who are enrolled in [place] or [place]; pregnant women with a Medicaid card; and children who have applied for Medicaid or [place] Health Choice, but were declined, whose parents can pay a reduced fee at time of service.  [facility] Department of [facility]  [address], [place] [phone] Accepts children up to age 90 who are enrolled in [place] or [place]; pregnant women with a Medicaid  card; and children who have applied for Medicaid or  Health Choice, but were declined, whose parents can pay a reduced fee at time of service.  [facility] Adult Dental Access PROGRAM  [place] [phone] Patients are seen by appointment only. Walk-ins are not accepted. [place] will see patients 27 years of age and older. Monday -  Tuesday (8am-5pm) Most Wednesdays (8:30-5pm) $30 per visit, cash only  [facility] Adult Dental Access PROGRAM  [address], [facility] [phone] Patients are seen by appointment only. Walk-ins are not accepted. [place] will see patients 70 years of age and older. One Wednesday Evening (Monthly: Volunteer Based).  $30 per visit, cash only  [place]  [phone] for adults; Children under age 73, call Graduate Pediatric Dentistry at [phone]. Children aged 27-14, please call [phone] to request a pediatric application.  Dental services are provided in all areas of dental care including fillings, crowns and bridges, complete and partial dentures, implants, gum treatment, root canals, and extractions. Preventive care is also provided. Treatment is provided to both adults and children. Patients are selected via a lottery and there is often a waiting list.   [facility] [address] [place]  [phone] [email or web address]   [facility] [address] [place], [place] [phone], Ext. [phone] Second and Fourth Thursday of each month, opens at 6:30 AM; Clinic ends at 9 AM.  Patients are seen on a first-come first-served basis, and a limited number are seen during each clinic.   [facility]  [address] [place], [place] [phone]   Eligibility Requirements You must have lived in [place], [place], or [place] counties for at least the last three months.   You cannot be eligible for state or federal sponsored [employer],  including [employer], [place], or [employer].   You generally cannot be eligible for healthcare insurance through your employer.    How to apply: Eligibility screenings are held every Tuesday and Wednesday afternoon from 1:00 pm until 4:00 pm. You do not need an appointment for the interview!  [facility] [address] [place], [place]   [place]  [place] Department  [place]  [phone]    Behavioral Health Resources in the Community: Intensive Outpatient Programs Organization         Address  Phone  Notes  [place]. [address], [place], [place] [phone]   [facility] [address], [place], [place]   ADS: Alcohol & Drug Svcs [address] [place], [place]   [place] [address],  [place], [place] or [phone]   Substance Abuse Resources Organization         Address  Phone  Notes  Alcohol and Drug Services  [phone]   [place]  [phone]   [facility] [place]   Chinita Pester  [phone]   Residential & Outpatient Substance Abuse Program  [phone]   Psychological Services Organization         Address  Phone  Notes  [facility] [place]  [place]  [phone]   [place] [address] [place] or [phone]    Mobile Crisis Teams Organization         Address  Phone  Notes  [facility], Mobile Crisis Care Unit  [phone]   [facility]  [address] [place], [place]   [place] [address], [place] [phone]    Self-Help/Support Groups Organization         Address  Phone  Notes  Mental Health Assoc.  of [place] - variety of support groups  [place] Call for more information  Narcotics Anonymous (NA), [facility] [address], [employer] [place]  2 meetings at this location   Special educational needs teacher         Address  Phone  Notes  [facility] [place],    [place]  [phone]   [facility]  [address], [place] [address], [place], [place]   Clyde Nibley, [place] [phone] Admissions: 8am-3pm M-F  Incentives Substance [place] [address],    [place], [place] [phone]   [facility] [address] [place], [place], [place]   [facility] [place] [address]  [place], [place]   [facility] - Intensive Outpatient [address], [place], [place]   [facility]  [place], [place] [phone] or [phone]   [facility] ([facility]S) [address], [place], [place] Accepts Medicaid  [facility] [place] [address]  [place] [phone] Substance Abuse/Addiction Treatment   [facility] Organization         Address  Phone  Notes  [facility]  [phone]   Domenic Schwab, PhD [address] [place], [place]   [phone] or [phone]   [place], [place] [phone]   [facility] [address], [place], [place] [phone] Insurance/Medicaid/sponsorship through [facility] and Families [address], [address] [place]                                    [place], [place] [phone] [place] [address], [place] [phone]    Dr. Adele Schilder  [phone]   [facility] Dept. 1) [address] [place] 2)  [place] 3)  [address], [place] [phone]  [phone]   [place] [phone] or [phone] (After Hours)

## 2013-11-19 ENCOUNTER — Encounter (HOSPITAL_COMMUNITY): Payer: Self-pay | Admitting: Emergency Medicine

## 2013-11-19 ENCOUNTER — Emergency Department (HOSPITAL_COMMUNITY)
Admission: EM | Admit: 2013-11-19 | Discharge: 2013-11-19 | Discharge status: Against Medical Advice | Payer: Self-pay | Attending: Emergency Medicine | Admitting: Emergency Medicine

## 2013-11-19 ENCOUNTER — Emergency Department (HOSPITAL_COMMUNITY): Payer: Self-pay

## 2013-11-19 DIAGNOSIS — M549 Dorsalgia, unspecified: Secondary | ICD-10-CM | POA: Insufficient documentation

## 2013-11-19 DIAGNOSIS — R002 Palpitations: Secondary | ICD-10-CM | POA: Insufficient documentation

## 2013-11-19 DIAGNOSIS — F419 Anxiety disorder, unspecified: Secondary | ICD-10-CM

## 2013-11-19 DIAGNOSIS — R51 Headache: Secondary | ICD-10-CM | POA: Insufficient documentation

## 2013-11-19 DIAGNOSIS — R202 Paresthesia of skin: Secondary | ICD-10-CM

## 2013-11-19 DIAGNOSIS — Z880 Allergy status to penicillin: Secondary | ICD-10-CM | POA: Insufficient documentation

## 2013-11-19 DIAGNOSIS — R21 Rash and other nonspecific skin eruption: Secondary | ICD-10-CM | POA: Insufficient documentation

## 2013-11-19 DIAGNOSIS — Z8719 Personal history of other diseases of the digestive system: Secondary | ICD-10-CM | POA: Insufficient documentation

## 2013-11-19 DIAGNOSIS — R209 Unspecified disturbances of skin sensation: Secondary | ICD-10-CM | POA: Insufficient documentation

## 2013-11-19 DIAGNOSIS — Z79899 Other long term (current) drug therapy: Secondary | ICD-10-CM | POA: Insufficient documentation

## 2013-11-19 DIAGNOSIS — M542 Cervicalgia: Secondary | ICD-10-CM | POA: Insufficient documentation

## 2013-11-19 DIAGNOSIS — Z91040 Latex allergy status: Secondary | ICD-10-CM | POA: Insufficient documentation

## 2013-11-19 DIAGNOSIS — F411 Generalized anxiety disorder: Secondary | ICD-10-CM | POA: Insufficient documentation

## 2013-11-19 MED ORDER — IBUPROFEN 800 MG PO TABS
800.0000 mg | ORAL_TABLET | Freq: Once | ORAL | Status: AC
  Administered 2013-11-19: 800 mg via ORAL
  Filled 2013-11-19: qty 1

## 2013-11-19 MED ORDER — DIAZEPAM 5 MG PO TABS: 5.0000 mg | ORAL_TABLET | Freq: Two times a day (BID) | ORAL | Status: DC

## 2013-11-19 MED ORDER — IBUPROFEN 800 MG PO TABS: 800.0000 mg | ORAL_TABLET | Freq: Three times a day (TID) | ORAL | Status: DC

## 2013-11-19 MED ORDER — DIAZEPAM 5 MG PO TABS
5.0000 mg | ORAL_TABLET | Freq: Once | ORAL | Status: AC
  Administered 2013-11-19: 5 mg via ORAL
  Filled 2013-11-19: qty 1

## 2013-11-19 NOTE — ED Notes (Signed)
Pt is still refusing EKG. I explained to her that an EKG could explain what is causing her heart palpitations and could be valuable to have later on for comparisons if a later illness were to affect her. Pt is concerned about the cost of the EKG being done. Will notify RN. PA is currently at bedside.

## 2013-11-19 NOTE — ED Notes (Addendum)
Pt reports for the past month she has had right sided neck pain that radiates upward and causing headaches and also downwards that is causing generalized pain and numbness. Pt also reports a rash to her chest. Pt states that she is concerned about a herniated disc and also states that she wants a full body MRI. Pt is A/O x4, in NAD, vitals are WDL, and pt is ambulatory to exam room.

## 2013-11-19 NOTE — ED Notes (Signed)
Pt is saying she does not want blood work or EKG, Charity fundraiserN and PA notified. Will re attempt when Pt returns to room.
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
## 2013-11-19 NOTE — Discharge Instructions (Signed)
Take valium for muscle spasm and or anxiety - take prescribed dose  Return to the emergency department if you develop any changing/worsening condition, severe headache, chest pain, weakness, loss of sensation, fever, stiff neck, difficulty breathing or any other concerns (please read additional information regarding your condition below)    Paresthesia Paresthesia is an abnormal burning or prickling sensation. This sensation is generally felt in the hands, arms, legs, or feet. However, it may occur in any part of the body. It is usually not painful. The feeling may be described as:  Tingling or numbness.  "Pins and needles."  Skin crawling.  Buzzing.  Limbs "falling asleep."  Itching. Most people experience temporary (transient) paresthesia at some time in their lives. CAUSES  Paresthesia may occur when you breathe too quickly (hyperventilation). It can also occur without any apparent cause. Commonly, paresthesia occurs when pressure is placed on a nerve. The feeling quickly goes away once the pressure is removed. For some people, however, paresthesia is a long-lasting (chronic) condition caused by an underlying disorder. The underlying disorder may be:  A traumatic, direct injury to nerves. Examples include a:  Broken (fractured) neck.  Fractured skull.  A disorder affecting the brain and spinal cord (central nervous system). Examples include:  Transverse myelitis.  Encephalitis.  Transient ischemic attack.  Multiple sclerosis.  Stroke.  Tumor or blood vessel problems, such as an arteriovenous malformation pressing against the brain or spinal cord.  A condition that damages the peripheral nerves (peripheral neuropathy). Peripheral nerves are not part of the brain and spinal cord. These conditions include:  Diabetes.  Peripheral vascular disease.  Nerve entrapment syndromes, such as carpal tunnel syndrome.  Shingles.  Hypothyroidism.  Vitamin B12  deficiencies.  Alcoholism.  Heavy metal poisoning (lead, arsenic).  Rheumatoid arthritis.  Systemic lupus erythematosus. DIAGNOSIS  Your caregiver will attempt to find the underlying cause of your paresthesia. Your caregiver may:  Take your medical history.  Perform a physical exam.  Order various lab tests.  Order imaging tests. TREATMENT  Treatment for paresthesia depends on the underlying cause. HOME CARE INSTRUCTIONS  Avoid drinking alcohol.  You may consider massage or acupuncture to help relieve your symptoms.  Keep all follow-up appointments as directed by your caregiver. SEEK IMMEDIATE MEDICAL CARE IF:   You feel weak.  You have trouble walking or moving.  You have problems with speech or vision.  You feel confused.  You cannot control your bladder or bowel movements.  You feel numbness after an injury.  You faint.  Your burning or prickling feeling gets worse when walking.  You have pain, cramps, or dizziness.  You develop a rash. MAKE SURE YOU:  Understand these instructions.  Will watch your condition.  Will get help right away if you are not doing well or get worse. Document Released: 03/31/2002 Document Revised: 07/03/2011 Document Reviewed: 12/30/2010 [facility] Patient Information 2015 [place], [place]. This information is not intended to replace advice given to you by your health care provider. Make sure you discuss any questions you have with your health care provider.  Palpitations A palpitation is the feeling that your heartbeat is irregular or is faster than normal. It may feel like your heart is fluttering or skipping a beat. Palpitations are usually not a serious problem. However, in some cases, you may need further medical evaluation. CAUSES  Palpitations can be caused by:  Smoking.  Caffeine or other stimulants, such as diet pills or energy drinks.  Alcohol.  Stress and anxiety.  Strenuous physical  activity.  Fatigue.  Certain medicines.  Heart disease, especially if you have a history of irregular heart rhythms (arrhythmias), such as atrial fibrillation, atrial flutter, or supraventricular tachycardia.  An improperly working pacemaker or defibrillator. DIAGNOSIS  To find the cause of your palpitations, your health care provider will take your medical history and perform a physical exam. Your health care provider may also have you take a test called an ambulatory electrocardiogram (ECG). An ECG records your heartbeat patterns over a 24-hour period. You may also have other tests, such as:  Transthoracic echocardiogram (TTE). During echocardiography, sound waves are used to evaluate how blood flows through your heart.  Transesophageal echocardiogram (TEE).  Cardiac monitoring. This allows your health care provider to monitor your heart rate and rhythm in real time.  Holter monitor. This is a portable device that records your heartbeat and can help diagnose heart arrhythmias. It allows your health care provider to track your heart activity for several days, if needed.  Stress tests by exercise or by giving medicine that makes the heart beat faster. TREATMENT  Treatment of palpitations depends on the cause of your symptoms and can vary greatly. Most cases of palpitations do not require any treatment other than time, relaxation, and monitoring your symptoms. Other causes, such as atrial fibrillation, atrial flutter, or supraventricular tachycardia, usually require further treatment. HOME CARE INSTRUCTIONS   Avoid:  Caffeinated coffee, tea, soft drinks, diet pills, and energy drinks.  Chocolate.  Alcohol.  Stop smoking if you smoke.  Reduce your stress and anxiety. Things that can help you relax include:  A method of controlling things in your body, such as your heartbeats, with your mind (biofeedback).  Yoga.  Meditation.  Physical activity such as swimming, jogging, or  walking.  Get plenty of rest and sleep. SEEK MEDICAL CARE IF:   You continue to have a fast or irregular heartbeat beyond 24 hours.  Your palpitations occur more often. SEEK IMMEDIATE MEDICAL CARE IF:  You have chest pain or shortness of breath.  You have a severe headache.  You feel dizzy or you faint. MAKE SURE YOU:  Understand these instructions.  Will watch your condition.  Will get help right away if you are not doing well or get worse. Document Released: 04/07/2000 Document Revised: 04/15/2013 Document Reviewed: 06/09/2011 [facility] Patient Information 2015 [place], [place]. This information is not intended to replace advice given to you by your health care provider. Make sure you discuss any questions you have with your health care provider.  Generalized Anxiety Disorder Generalized anxiety disorder (GAD) is a mental disorder. It interferes with life functions, including relationships, work, and school. GAD is different from normal anxiety, which everyone experiences at some point in their lives in response to specific life events and activities. Normal anxiety actually helps [place] prepare for and get through these life events and activities. Normal anxiety goes away after the event or activity is over.  GAD causes anxiety that is not necessarily related to specific events or activities. It also causes excess anxiety in proportion to specific events or activities. The anxiety associated with GAD is also difficult to control. GAD can vary from mild to severe. People with severe GAD can have intense waves of anxiety with physical symptoms (panic attacks).  SYMPTOMS The anxiety and worry associated with GAD are difficult to control. This anxiety and worry are related to many life events and activities and also occur more days than not for 6 months or longer.  People with GAD also have three or more of the following symptoms (one or more in children): Restlessness.  Fatigue. Difficulty  concentrating.  Irritability. Muscle tension. Difficulty sleeping or unsatisfying sleep. DIAGNOSIS GAD is diagnosed through an assessment by your health care provider. Your health care provider will ask you questions aboutyour mood,physical symptoms, and events in your life. Your health care provider may ask you about your medical history and use of alcohol or drugs, including prescription medicines. Your health care provider may also do a physical exam and blood tests. Certain medical conditions and the use of certain substances can cause symptoms similar to those associated with GAD. Your health care provider may refer you to a mental health specialist for further evaluation. TREATMENT The following therapies are usually used to treat GAD:  Medication. Antidepressant medication usually is prescribed for long-term daily control. Antianxiety medicines may be added in severe cases, especially when panic attacks occur.  Talk therapy (psychotherapy). Certain types of talk therapy can be helpful in treating GAD by providing support, education, and guidance. A form of talk therapy called cognitive behavioral therapy can teach you healthy ways to think about and react to daily life events and activities. Stress managementtechniques. These include yoga, meditation, and exercise and can be very helpful when they are practiced regularly. A mental health specialist can help determine which treatment is best for you. Some people see improvement with one therapy. However, other people require a combination of therapies. Document Released: 08/05/2012 Document Revised: 08/25/2013 Document Reviewed: 08/05/2012 [facility] Patient Information 2015 [place], [place]. This information is not intended to replace advice given to you by your health care provider. Make sure you discuss any questions you have with your health care provider.

## 2013-11-21 NOTE — ED Provider Notes (Signed)
Medical screening examination/treatment/procedure(s) were performed by non-physician practitioner and as supervising physician I was immediately available for consultation/collaboration.   EKG Interpretation None       Beulah Matusek, MD 11/21/13 2212 
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
## 2016-12-18 ENCOUNTER — Emergency Department (HOSPITAL_COMMUNITY)
Admission: EM | Admit: 2016-12-18 | Discharge: 2016-12-18 | Disposition: A | Discharge status: Home / Self Care | Payer: No Typology Code available for payment source | Attending: Emergency Medicine | Admitting: Emergency Medicine

## 2016-12-18 ENCOUNTER — Encounter (HOSPITAL_COMMUNITY): Payer: Self-pay | Admitting: *Deleted

## 2016-12-18 ENCOUNTER — Emergency Department (HOSPITAL_COMMUNITY): Payer: No Typology Code available for payment source

## 2016-12-18 DIAGNOSIS — Z91040 Latex allergy status: Secondary | ICD-10-CM | POA: Diagnosis not present

## 2016-12-18 DIAGNOSIS — Z79899 Other long term (current) drug therapy: Secondary | ICD-10-CM | POA: Diagnosis not present

## 2016-12-18 DIAGNOSIS — Y9389 Activity, other specified: Secondary | ICD-10-CM | POA: Insufficient documentation

## 2016-12-18 DIAGNOSIS — S199XXA Unspecified injury of neck, initial encounter: Secondary | ICD-10-CM | POA: Diagnosis present

## 2016-12-18 DIAGNOSIS — Y999 Unspecified external cause status: Secondary | ICD-10-CM | POA: Diagnosis not present

## 2016-12-18 DIAGNOSIS — S161XXA Strain of muscle, fascia and tendon at neck level, initial encounter: Secondary | ICD-10-CM | POA: Insufficient documentation

## 2016-12-18 DIAGNOSIS — Y92410 Unspecified street and highway as the place of occurrence of the external cause: Secondary | ICD-10-CM | POA: Diagnosis not present

## 2016-12-18 MED ORDER — DICLOFENAC SODIUM 50 MG PO TBEC: 50.0000 mg | DELAYED_RELEASE_TABLET | Freq: Two times a day (BID) | ORAL | 0 refills | Status: AC

## 2016-12-18 MED ORDER — CYCLOBENZAPRINE HCL 10 MG PO TABS: 10.0000 mg | ORAL_TABLET | Freq: Two times a day (BID) | ORAL | 0 refills | Status: AC | PRN

## 2016-12-18 MED ORDER — IBUPROFEN 200 MG PO TABS
400.0000 mg | ORAL_TABLET | Freq: Once | ORAL | Status: AC
  Administered 2016-12-18: 400 mg via ORAL
  Filled 2016-12-18: qty 2

## 2016-12-18 MED ORDER — CYCLOBENZAPRINE HCL 10 MG PO TABS
10.0000 mg | ORAL_TABLET | Freq: Once | ORAL | Status: AC
  Administered 2016-12-18: 10 mg via ORAL
  Filled 2016-12-18: qty 1
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
## 2016-12-18 NOTE — ED Notes (Signed)
Patient transported to X-ray 
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
## 2016-12-18 NOTE — Discharge Instructions (Signed)
Do not take the muscle relaxant if driving as it will make you sleepy.follow up with your doctor or return here for worsening symptoms.

## 2017-03-09 ENCOUNTER — Encounter (HOSPITAL_COMMUNITY): Payer: Self-pay

## 2017-03-09 ENCOUNTER — Emergency Department (HOSPITAL_COMMUNITY)
Admission: EM | Admit: 2017-03-09 | Discharge: 2017-03-09 | Disposition: A | Discharge status: Home / Self Care | Payer: Self-pay | Attending: Emergency Medicine | Admitting: Emergency Medicine

## 2017-03-09 ENCOUNTER — Emergency Department (HOSPITAL_COMMUNITY): Payer: Self-pay

## 2017-03-09 ENCOUNTER — Other Ambulatory Visit: Payer: Self-pay

## 2017-03-09 DIAGNOSIS — F4310 Post-traumatic stress disorder, unspecified: Secondary | ICD-10-CM | POA: Insufficient documentation

## 2017-03-09 DIAGNOSIS — R0602 Shortness of breath: Secondary | ICD-10-CM | POA: Insufficient documentation

## 2017-03-09 DIAGNOSIS — N760 Acute vaginitis: Secondary | ICD-10-CM | POA: Insufficient documentation

## 2017-03-09 DIAGNOSIS — Z202 Contact with and (suspected) exposure to infections with a predominantly sexual mode of transmission: Secondary | ICD-10-CM | POA: Insufficient documentation

## 2017-03-09 DIAGNOSIS — B009 Herpesviral infection, unspecified: Secondary | ICD-10-CM | POA: Insufficient documentation

## 2017-03-09 DIAGNOSIS — Z91040 Latex allergy status: Secondary | ICD-10-CM | POA: Insufficient documentation

## 2017-03-09 DIAGNOSIS — Z79899 Other long term (current) drug therapy: Secondary | ICD-10-CM | POA: Insufficient documentation

## 2017-03-09 DIAGNOSIS — B9689 Other specified bacterial agents as the cause of diseases classified elsewhere: Secondary | ICD-10-CM

## 2017-03-09 DIAGNOSIS — R590 Localized enlarged lymph nodes: Secondary | ICD-10-CM | POA: Insufficient documentation

## 2017-03-09 DIAGNOSIS — F22 Delusional disorders: Secondary | ICD-10-CM | POA: Insufficient documentation

## 2017-03-09 LAB — CBC WITH DIFFERENTIAL/PLATELET
BASOS ABS: 0 10*3/uL (ref 0.0–0.1)
BASOS PCT: 0 %
Eosinophils Absolute: 0 10*3/uL (ref 0.0–0.7)
Eosinophils Relative: 0 %
HEMATOCRIT: 38.3 % (ref 36.0–46.0)
HEMOGLOBIN: 13 g/dL (ref 12.0–15.0)
Lymphocytes Relative: 21 %
Lymphs Abs: 1.6 10*3/uL (ref 0.7–4.0)
MCH: 31.7 pg (ref 26.0–34.0)
MCHC: 33.9 g/dL (ref 30.0–36.0)
MCV: 93.4 fL (ref 78.0–100.0)
Monocytes Absolute: 0.5 10*3/uL (ref 0.1–1.0)
Monocytes Relative: 7 %
NEUTROS PCT: 72 %
Neutro Abs: 5.5 10*3/uL (ref 1.7–7.7)
Platelets: 263 10*3/uL (ref 150–400)
RBC: 4.1 MIL/uL (ref 3.87–5.11)
RDW: 13 % (ref 11.5–15.5)
WBC: 7.7 10*3/uL (ref 4.0–10.5)

## 2017-03-09 LAB — WET PREP, GENITAL
Sperm: NONE SEEN
Trich, Wet Prep: NONE SEEN

## 2017-03-09 LAB — RPR: RPR: NONREACTIVE

## 2017-03-09 LAB — COMPREHENSIVE METABOLIC PANEL
ALT: 13 U/L — ABNORMAL LOW (ref 14–54)
AST: 17 U/L (ref 15–41)
Albumin: 4.1 g/dL (ref 3.5–5.0)
Alkaline Phosphatase: 45 U/L (ref 38–126)
Anion gap: 8 (ref 5–15)
BUN: 11 mg/dL (ref 6–20)
CALCIUM: 9 mg/dL (ref 8.9–10.3)
CO2: 23 mmol/L (ref 22–32)
Chloride: 103 mmol/L (ref 101–111)
Creatinine, Ser: 0.68 mg/dL (ref 0.44–1.00)
GFR calc Af Amer: >60 mL/min (ref >60)
GFR calc non Af Amer: >60 mL/min (ref >60)
Glucose, Bld: 102 mg/dL — ABNORMAL HIGH (ref 65–99)
POTASSIUM: 3.2 mmol/L — AB (ref 3.5–5.1)
Sodium: 134 mmol/L — ABNORMAL LOW (ref 135–145)
Total Bilirubin: 2.1 mg/dL — ABNORMAL HIGH (ref 0.3–1.2)
Total Protein: 7.1 g/dL (ref 6.5–8.1)

## 2017-03-09 LAB — RAPID HIV SCREEN (HIV 1/2 AB+AG)
HIV 1/2 Antibodies: NONREACTIVE
HIV-1 P24 ANTIGEN - HIV24: NONREACTIVE
Interpretation (HIV Ag Ab): NONREACTIVE

## 2017-03-09 LAB — I-STAT BETA HCG BLOOD, ED (MC, WL, AP ONLY): I-stat hCG, quantitative: <5 m[IU]/mL (ref <5)

## 2017-03-09 LAB — MONONUCLEOSIS SCREEN: MONO SCREEN: NEGATIVE

## 2017-03-09 MED ORDER — STERILE WATER FOR INJECTION IJ SOLN
INTRAMUSCULAR | Status: AC
  Filled 2017-03-09: qty 10

## 2017-03-09 MED ORDER — MAGIC MOUTHWASH W/LIDOCAINE: 5.0000 mL | Freq: Three times a day (TID) | ORAL | 0 refills | Status: AC | PRN

## 2017-03-09 MED ORDER — AZITHROMYCIN 250 MG PO TABS
1000.0000 mg | ORAL_TABLET | Freq: Once | ORAL | Status: AC
  Administered 2017-03-09: 1000 mg via ORAL
  Filled 2017-03-09: qty 4

## 2017-03-09 MED ORDER — VALACYCLOVIR HCL 1 G PO TABS: 1000.0000 mg | ORAL_TABLET | Freq: Three times a day (TID) | ORAL | 0 refills | Status: AC

## 2017-03-09 MED ORDER — DOXYCYCLINE HYCLATE 100 MG PO CAPS: 100.0000 mg | ORAL_CAPSULE | Freq: Two times a day (BID) | ORAL | 0 refills | Status: AC

## 2017-03-09 MED ORDER — ONDANSETRON 4 MG PO TBDP: 4.0000 mg | ORAL_TABLET | Freq: Three times a day (TID) | ORAL | 0 refills | Status: AC | PRN

## 2017-03-09 MED ORDER — SODIUM CHLORIDE 0.9 % IV BOLUS (SEPSIS)
1000.0000 mL | Freq: Once | INTRAVENOUS | Status: AC
  Administered 2017-03-09: 1000 mL via INTRAVENOUS

## 2017-03-09 MED ORDER — METRONIDAZOLE 500 MG PO TABS: 500.0000 mg | ORAL_TABLET | Freq: Two times a day (BID) | ORAL | 0 refills | Status: AC

## 2017-03-09 MED ORDER — FLUCONAZOLE 100 MG PO TABS
150.0000 mg | ORAL_TABLET | Freq: Once | ORAL | Status: AC
  Administered 2017-03-09: 150 mg via ORAL
  Filled 2017-03-09: qty 2

## 2017-03-09 MED ORDER — MORPHINE SULFATE (PF) 4 MG/ML IV SOLN
4.0000 mg | Freq: Once | INTRAVENOUS | Status: AC
  Administered 2017-03-09: 4 mg via INTRAVENOUS
  Filled 2017-03-09: qty 1

## 2017-03-09 MED ORDER — IBUPROFEN 600 MG PO TABS: 600.0000 mg | ORAL_TABLET | Freq: Four times a day (QID) | ORAL | 0 refills | Status: AC | PRN

## 2017-03-09 MED ORDER — MAGIC MOUTHWASH
10.0000 mL | Freq: Once | ORAL | Status: AC
  Administered 2017-03-09: 10 mL via ORAL
  Filled 2017-03-09: qty 10

## 2017-03-09 MED ORDER — KETOROLAC TROMETHAMINE 15 MG/ML IJ SOLN
15.0000 mg | Freq: Once | INTRAMUSCULAR | Status: AC
  Administered 2017-03-09: 15 mg via INTRAVENOUS
  Filled 2017-03-09: qty 1

## 2017-03-09 MED ORDER — POTASSIUM CHLORIDE CRYS ER 20 MEQ PO TBCR
40.0000 meq | EXTENDED_RELEASE_TABLET | Freq: Once | ORAL | Status: AC
  Administered 2017-03-09: 40 meq via ORAL
  Filled 2017-03-09: qty 2

## 2017-03-09 MED ORDER — CEFTRIAXONE SODIUM 250 MG IJ SOLR
250.0000 mg | Freq: Once | INTRAMUSCULAR | Status: AC
  Administered 2017-03-09: 250 mg via INTRAMUSCULAR
  Filled 2017-03-09: qty 250

## 2017-03-09 MED ORDER — ONDANSETRON HCL 4 MG/2ML IJ SOLN
4.0000 mg | Freq: Once | INTRAMUSCULAR | Status: AC
  Administered 2017-03-09: 4 mg via INTRAVENOUS
  Filled 2017-03-09: qty 2

## 2017-03-09 NOTE — ED Provider Notes (Signed)
[facility] EMERGENCY DEPARTMENT Provider Note   CSN: [id number] Arrival date & time: 03/09/17  4098     History   Chief Complaint Chief Complaint  Patient presents with  . Neck Pain    HPI Taylor Sandoval is a 31 y.o. female.  HPI   31 year old female with past medical history of OCD, PTSD, here with mouth sores and genital sores.  The patient states that she recently engaged in sexual intercourse with another female.  They kissed as well as had digital to vaginal and vaginal to vaginal intercourse.  The patient states that several days later, she noticed tingling and burning pain in her mouth as well as general area.  Since then, she has noticed increasing mouth pain, as well as genital sores.  She has had associated lymphadenopathy in her neck.  She reports generalized fatigue, nausea, but no vomiting.  She states that her pain started as red ulcers in her mouth as well as genital area.  They have since spread to her rectal area.  She has associated generalized fatigue.  Denies any fevers.  Denies any previous history of similar symptoms.  She does not have a history of cold sores or herpes.  She has never been sexually active prior to this episode.  Past Medical History:  Diagnosis Date  . Constipation   . Heart palpitations   . OCD (obsessive compulsive disorder)   . PTSD (post-traumatic stress disorder)     Patient Active Problem List   Diagnosis Date Noted  . Delusional disorder(297.1) 09/16/2012    Past Surgical History:  Procedure Laterality Date  . WISDOM TOOTH EXTRACTION      OB History    Gravida Para Term Preterm AB Living   0             SAB TAB Ectopic Multiple Live Births                   Home Medications    Prior to Admission medications   Medication Sig Start Date End Date Taking? Authorizing Provider  acetaminophen (TYLENOL) 500 MG tablet Take 1,000 mg by mouth every 6 (six) hours as needed for mild pain.    [provider]  chlordiazePOXIDE (LIBRIUM) 25 MG capsule Take 25 mg by mouth 2 (two) times daily.    [provider]  cyclobenzaprine (FLEXERIL) 10 MG tablet Take 1 tablet (10 mg total) by mouth 2 (two) times daily as needed for muscle spasms. 12/18/16   Janne Napoleon, NP  diclofenac (VOLTAREN) 50 MG EC tablet Take 1 tablet (50 mg total) by mouth 2 (two) times daily. 12/18/16   Janne Napoleon, NP  doxycycline (VIBRAMYCIN) 100 MG capsule Take 1 capsule (100 mg total) 2 (two) times daily for 14 days by mouth. 03/09/17 03/23/17  Shaune Pollack, MD  ibuprofen (ADVIL,MOTRIN) 600 MG tablet Take 1 tablet (600 mg total) every 6 (six) hours as needed by mouth for moderate pain. 03/09/17   Shaune Pollack, MD  magic mouthwash w/lidocaine SOLN Take 5 mLs 3 (three) times daily as needed by mouth for mouth pain. 03/09/17   Shaune Pollack, MD  metroNIDAZOLE (FLAGYL) 500 MG tablet Take 1 tablet (500 mg total) 2 (two) times daily by mouth. 03/09/17   Shaune Pollack, MD  ondansetron (ZOFRAN ODT) 4 MG disintegrating tablet Take 1 tablet (4 mg total) every 8 (eight) hours as needed by mouth for nausea or vomiting. 03/09/17   Shaune Pollack, MD  valACYclovir (VALTREX) 1000 MG tablet Take 1 tablet (1,000 mg total) 3 (three) times daily for 10 days by mouth. 03/09/17 03/19/17  Shaune PollackIsaacs, Arty Lantzy, MD    Family History History reviewed. No pertinent family history.  Social History Social History   Tobacco Use  . Smoking status: Never Smoker  . Smokeless tobacco: Never Used  Substance Use Topics  . Alcohol use: No  . Drug use: No     Allergies   Latex; Gluten meal; Lactose intolerance (gi); Amoxicillin; Benadryl [diphenhydramine hcl]; and Penicillins   Review of Systems Review of Systems  Constitutional: Positive for chills and fatigue. Negative for fever.  HENT: Positive for mouth sores.   Musculoskeletal: Positive for arthralgias and myalgias.  Skin: Positive for rash.  All other systems  reviewed and are negative.    Physical Exam Updated Vital Signs BP 138/80 (BP Location: Right Arm)   Pulse (!) 118   Temp 98.6 F (37 C) (Oral)   Resp 20   LMP 02/22/2017 (Within Days)   SpO2 100%   Physical Exam  Constitutional: She is oriented to person, place, and time. She appears well-developed and well-nourished. No distress.  HENT:  Head: Normocephalic and atraumatic.  Superficial, erythematous ulcerations to the lips and gingiva, with erythematous base.  No oral thrush.  Posterior pharynx mildly erythematous.  There is associated tender anterior and  posterior cervical lymphadenopathy.  Eyes: Conjunctivae are normal.  Neck: Neck supple.  Cardiovascular: Normal rate, regular rhythm and normal heart sounds. Exam reveals no friction rub.  No murmur heard. Pulmonary/Chest: Effort normal and breath sounds normal. No respiratory distress. She has no wheezes. She has no rales.  Abdominal: She exhibits no distension.  Genitourinary:  Genitourinary Comments: Chaperoned exam.  There are superficial erythematous vesicles around the labia majora and minora, with some extension near the rectum.  No cervical tenderness.  Musculoskeletal: She exhibits no edema.  Neurological: She is alert and oriented to person, place, and time. She exhibits normal muscle tone.  Skin: Skin is warm. Capillary refill takes less than 2 seconds.  Psychiatric: She has a normal mood and affect.  Nursing note and vitals reviewed.    ED Treatments / Results  Labs (all labs ordered are listed, but only abnormal results are displayed) Labs Reviewed  WET PREP, GENITAL - Abnormal; Notable for the following components:      Result Value   Yeast Wet Prep HPF POC PRESENT (*)    Clue Cells Wet Prep HPF POC PRESENT (*)    WBC, Wet Prep HPF POC PRESENT (*)    All other components within normal limits  COMPREHENSIVE METABOLIC PANEL - Abnormal; Notable for the following components:   Sodium 134 (*)    Potassium  3.2 (*)    Glucose, Bld 102 (*)    ALT 13 (*)    Total Bilirubin 2.1 (*)    All other components within normal limits  CBC WITH DIFFERENTIAL/PLATELET  RAPID HIV SCREEN (HIV 1/2 AB+AG)  RPR  URINALYSIS, ROUTINE W REFLEX MICROSCOPIC  MONONUCLEOSIS SCREEN  I-STAT BETA HCG BLOOD, ED ([facility], [facility], [facility] ONLY)  GC/CHLAMYDIA PROBE AMP ([place]) NOT AT [facility]    EKG  EKG Interpretation  Date/Time:  Friday March 09 2017 07:01:32 EST Ventricular Rate:  120 PR Interval:  124 QRS Duration: 92 QT Interval:  344 QTC Calculation: 486 R Axis:   69 Text Interpretation:  Sinus tachycardia Right atrial enlargement Borderline ECG No old tracing to compare Borderline prolonged QTc Confirmed by  Shaune Pollacksaacs, Berenise Hunton [id number]) on 03/09/2017 7:06:00 AM       Radiology Dg Chest 2 View  Result Date: 03/09/2017 CLINICAL DATA:  Shortness of breath. EXAM: CHEST  2 VIEW COMPARISON:  One-view chest x-ray 09/12/2012. FINDINGS: The heart size and mediastinal contours are within normal limits. Both lungs are clear. The visualized skeletal structures are unremarkable. IMPRESSION: Negative two view chest x-ray Electronically Signed   By: Marin Robertshristopher  Mattern M.D.   On: 03/09/2017 09:29    Procedures Procedures (including critical care time)  Medications Ordered in ED Medications  potassium chloride SA (K-DUR,KLOR-CON) CR tablet 40 mEq (not administered)  cefTRIAXone (ROCEPHIN) injection 250 mg (not administered)  azithromycin (ZITHROMAX) tablet 1,000 mg (not administered)  fluconazole (DIFLUCAN) tablet 150 mg (not administered)  sodium chloride 0.9 % bolus 1,000 mL (0 mLs Intravenous Stopped 03/09/17 1022)  morphine 4 MG/ML injection 4 mg (4 mg Intravenous Given 03/09/17 1021)  ondansetron (ZOFRAN) injection 4 mg (4 mg Intravenous Given 03/09/17 1020)  ketorolac (TORADOL) 15 MG/ML injection 15 mg (15 mg Intravenous Given 03/09/17 1026)  magic mouthwash (10 mLs Oral Given 03/09/17 1030)     Initial Impression /  Assessment and Plan / ED Course  I have reviewed the triage vital signs and the nursing notes.  Pertinent labs & imaging results that were available during my care of the patient were reviewed by me and considered in my medical decision making (see chart for details).     31 year old female with past medical history as above here with STD exposure and subsequent painful rash in the mouth and genital area.  Exam and history is consistent with likely primary herpes gingivostomatitis and genital infection.  She is also noted to have a yeast infection and positive clue cells.  HIV negative.  Patient elects for empiric treatment.  She is very anxious and concerned about her health and herpes and I spent a significant amount of time explaining the natural time course and progression of herpes.  She is in agreement with this plan.  She is tolerating p.o. after fluids.  She has no signs of sepsis or systemic illness.  She is nondiabetic or immune suppressed.  Her heart rate is in the 90s in the room, but does increase when examiner is in room and secondary to anxiety.  I do not suspect she is systemically ill.  Will empirically treat her, and discharged with outpatient follow-up.  HIV test negative.  This note was prepared with assistance of Conservation officer, historic buildingsDragon voice recognition software. Occasional wrong-word or sound-a-like substitutions may have occurred due to the inherent limitations of voice recognition software.   Final Clinical Impressions(s) / ED Diagnoses   Final diagnoses:  Primary herpes simplex  BV (bacterial vaginosis)  STD exposure    ED Discharge Orders        Ordered    metroNIDAZOLE (FLAGYL) 500 MG tablet  2 times daily     03/09/17 1102    doxycycline (VIBRAMYCIN) 100 MG capsule  2 times daily     03/09/17 1102    valACYclovir (VALTREX) 1000 MG tablet  3 times daily     03/09/17 1102    ondansetron (ZOFRAN ODT) 4 MG disintegrating tablet  Every 8 hours PRN     03/09/17 1102    ibuprofen  (ADVIL,MOTRIN) 600 MG tablet  Every 6 hours PRN     03/09/17 1102    magic mouthwash w/lidocaine SOLN  3 times daily PRN     03/09/17 1102  Shaune Pollack, MD 03/09/17 [phone]

## 2017-03-09 NOTE — Discharge Instructions (Signed)

## 2017-03-09 NOTE — ED Triage Notes (Addendum)
Pt states she reported to health dept for STD symptoms 3 days ago. She states she has sores on her mouth and in her perineal area. She reports some sexual activity with a new sexual partner last week. She reports neck pain and stiffness as well but denies fever.

## 2017-03-11 ENCOUNTER — Encounter (HOSPITAL_COMMUNITY): Payer: Self-pay

## 2017-03-11 ENCOUNTER — Other Ambulatory Visit: Payer: Self-pay

## 2017-03-11 DIAGNOSIS — G4489 Other headache syndrome: Secondary | ICD-10-CM | POA: Insufficient documentation

## 2017-03-11 DIAGNOSIS — Z79899 Other long term (current) drug therapy: Secondary | ICD-10-CM | POA: Insufficient documentation

## 2017-03-11 DIAGNOSIS — Z91040 Latex allergy status: Secondary | ICD-10-CM | POA: Insufficient documentation

## 2017-03-11 NOTE — ED Triage Notes (Signed)
Pt states onset 2 days headaches, head is sensitive and lumps gets larger. Pt believes she has intestinal parasites from recent encounter [place] from [place].  Also, c/o blurred vision.  Pt had this nurse put several brown specks into specimen cup that she pulled from mouth.

## 2017-03-12 ENCOUNTER — Emergency Department (HOSPITAL_COMMUNITY)
Admission: EM | Admit: 2017-03-12 | Discharge: 2017-03-12 | Disposition: A | Discharge status: Home / Self Care | Payer: Self-pay | Attending: Emergency Medicine | Admitting: Emergency Medicine

## 2017-03-12 DIAGNOSIS — G4489 Other headache syndrome: Secondary | ICD-10-CM

## 2017-03-12 LAB — CBC WITH DIFFERENTIAL/PLATELET
Basophils Absolute: 0 10*3/uL (ref 0.0–0.1)
Basophils Relative: 0 %
EOS ABS: 0.1 10*3/uL (ref 0.0–0.7)
EOS PCT: 2 %
HCT: 39.1 % (ref 36.0–46.0)
HEMOGLOBIN: 13.3 g/dL (ref 12.0–15.0)
LYMPHS ABS: 2.7 10*3/uL (ref 0.7–4.0)
LYMPHS PCT: 31 %
MCH: 31.7 pg (ref 26.0–34.0)
MCHC: 34 g/dL (ref 30.0–36.0)
MCV: 93.3 fL (ref 78.0–100.0)
MONOS PCT: 12 %
Monocytes Absolute: 1.1 10*3/uL — ABNORMAL HIGH (ref 0.1–1.0)
Neutro Abs: 4.9 10*3/uL (ref 1.7–7.7)
Neutrophils Relative %: 55 %
PLATELETS: 224 10*3/uL (ref 150–400)
RBC: 4.19 MIL/uL (ref 3.87–5.11)
RDW: 13.1 % (ref 11.5–15.5)
WBC: 8.9 10*3/uL (ref 4.0–10.5)

## 2017-03-12 LAB — BASIC METABOLIC PANEL
Anion gap: 7 (ref 5–15)
CHLORIDE: 104 mmol/L (ref 101–111)
CO2: 25 mmol/L (ref 22–32)
CREATININE: 0.69 mg/dL (ref 0.44–1.00)
Calcium: 9.1 mg/dL (ref 8.9–10.3)
GFR calc Af Amer: >60 mL/min (ref >60)
GFR calc non Af Amer: >60 mL/min (ref >60)
Glucose, Bld: 86 mg/dL (ref 65–99)
POTASSIUM: 3.5 mmol/L (ref 3.5–5.1)
SODIUM: 136 mmol/L (ref 135–145)

## 2017-03-12 LAB — URINALYSIS, ROUTINE W REFLEX MICROSCOPIC
Bilirubin Urine: NEGATIVE
GLUCOSE, UA: NEGATIVE mg/dL
Hgb urine dipstick: NEGATIVE
Ketones, ur: NEGATIVE mg/dL
LEUKOCYTES UA: NEGATIVE
Nitrite: NEGATIVE
Protein, ur: NEGATIVE mg/dL
Specific Gravity, Urine: 1.002 — ABNORMAL LOW (ref 1.005–1.030)
pH: 6 (ref 5.0–8.0)

## 2017-03-12 LAB — GC/CHLAMYDIA PROBE AMP (~~LOC~~) NOT AT ARMC
Chlamydia: NEGATIVE
Neisseria Gonorrhea: NEGATIVE

## 2017-03-12 LAB — POC URINE PREG, ED: Preg Test, Ur: NEGATIVE

## 2017-03-12 MED ORDER — DOCOSANOL 10 % EX CREA: 1.0000 "application " | TOPICAL_CREAM | Freq: Three times a day (TID) | CUTANEOUS | 0 refills | Status: AC | PRN

## 2017-03-12 MED ORDER — KETOROLAC TROMETHAMINE 30 MG/ML IJ SOLN
30.0000 mg | Freq: Once | INTRAMUSCULAR | Status: AC
  Administered 2017-03-12: 30 mg via INTRAVENOUS
  Filled 2017-03-12: qty 1

## 2017-03-12 MED ORDER — METOCLOPRAMIDE HCL 5 MG/ML IJ SOLN
10.0000 mg | Freq: Once | INTRAMUSCULAR | Status: AC
  Administered 2017-03-12: 10 mg via INTRAVENOUS
  Filled 2017-03-12: qty 2

## 2017-03-12 NOTE — ED Provider Notes (Signed)
[facility]   CSN: [id number] Arrival date & time: 03/11/17  1912     History   Chief Complaint Chief Complaint  Patient presents with  . Headache    HPI Taylor Sandoval is a 31 y.o. female.  The history is provided by the patient.  Headache   This is a new problem. The current episode started more than 2 days ago. The problem occurs constantly. The problem has been gradually worsening. Associated with: recent herpes exposure. The pain is severe. Radiates to: from neck to front of head, into her nose and throughout her body. Associated symptoms include nausea. Pertinent negatives include no fever.   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 Constipation   . Heart palpitations   . OCD (obsessive compulsive disorder)   . PTSD (post-traumatic stress disorder)     Patient Active Problem List   Diagnosis Date Noted  . Delusional disorder(297.1) 09/16/2012    Past Surgical History:  Procedure Laterality Date  . WISDOM TOOTH EXTRACTION      OB History    Gravida Para Term Preterm AB Living   0             SAB TAB Ectopic Multiple Live Births                   Home Medications    Prior to Admission medications   Medication Sig Start Date End Date Taking? Authorizing Provider  acetaminophen (TYLENOL) 500 MG tablet Take 1,000 mg by mouth every 6 (six) hours as needed for mild pain.    [provider]  chlordiazePOXIDE (LIBRIUM) 25 MG capsule Take 25 mg by mouth 2 (two) times daily.    [provider]  cyclobenzaprine (FLEXERIL) 10 MG tablet  Take 1 tablet (10 mg total) by mouth 2 (two) times daily as needed for muscle spasms. 12/18/16   Janne NapoleonNeese, Hope M, NP  diclofenac (VOLTAREN) 50 MG EC tablet Take 1 tablet (50 mg total) by mouth 2 (two) times daily. 12/18/16   Janne NapoleonNeese, Hope M, NP  doxycycline (VIBRAMYCIN) 100 MG capsule Take 1 capsule (100 mg total) 2 (two) times daily for 14 days by mouth. 03/09/17 03/23/17  Shaune PollackIsaacs, Cameron, MD  ibuprofen (ADVIL,MOTRIN) 600 MG tablet Take 1 tablet (600 mg total) every 6 (six) hours as needed by mouth for moderate pain. 03/09/17   Shaune PollackIsaacs, Cameron, MD  magic mouthwash w/lidocaine SOLN Take 5 mLs 3 (three) times daily as needed by mouth for mouth pain. 03/09/17   Shaune PollackIsaacs, Cameron, MD  metroNIDAZOLE (FLAGYL) 500 MG tablet Take 1 tablet (500 mg total) 2 (two) times daily by mouth. 03/09/17   Shaune PollackIsaacs, Cameron, MD  ondansetron (ZOFRAN ODT) 4 MG disintegrating tablet Take 1 tablet (4 mg total) every 8 (eight) hours as needed by mouth for nausea or vomiting. 03/09/17   Shaune PollackIsaacs, Cameron, MD  valACYclovir (VALTREX) 1000 MG tablet Take 1 tablet (1,000 mg total) 3 (three) times daily for 10 days by mouth. 03/09/17 03/19/17  Shaune PollackIsaacs, Cameron, MD    Family History History reviewed. No pertinent family history.  Social History Social History   Tobacco Use  . Smoking status: Never Smoker  . Smokeless tobacco: Never Used  Substance Use Topics  . Alcohol use: Yes    Comment: rarely   . Drug use: No     Allergies   Latex; Gluten meal; Lactose intolerance (gi); Amoxicillin; Benadryl [diphenhydramine hcl]; and Penicillins   Review of Systems Review of Systems  Constitutional: Negative for fever.  Gastrointestinal: Positive for nausea.  Neurological: Positive for headaches.  All other systems reviewed and are negative.    Physical Exam Updated Vital Signs BP (!) 138/98 (BP Location: Right Arm)   Pulse 96   Temp 98.4 F (36.9 C) (Oral)   Resp 18   LMP 02/22/2017 (Within Days)   SpO2 100%   Physical  Exam  CONSTITUTIONAL: Well developed/well nourished, anxious HEAD: Normocephalic/atraumatic, no lesions noted to scalp EYES: EOMI/PERRL, no nystagmus, no ptosis, no conjunctival erythema, no conjunctival discharge ENMT: Mucous membranes moist, there is no rash on face or surrounding orbits.  Scattered superficial lesions noted to lower lip.  Dried lips noted NECK: supple no meningeal signs  SPINE/BACK:entire spine nontender CV: S1/S2 noted, no murmurs/rubs/gallops noted LUNGS: Lungs are clear to auscultation bilaterally, no apparent distress ABDOMEN: soft, nontender, no rebound or guarding NEURO:Awake/alert, face symmetric, no arm or leg drift is noted Equal 5/5 strength with shoulder abduction, elbow flex/extension, wrist flex/extension in upper extremities and equal hand grips bilaterally Equal 5/5 strength with hip flexion,knee flex/extension, foot dorsi/plantar flexion Cranial nerves 3/4/5/6/10/30/08/11/12 tested and intact Gait normal without ataxia No past pointing Sensation to light touch intact in all extremities EXTREMITIES: pulses normal, full ROM SKIN: warm, color normal PSYCH: anxious  ED Treatments / Results  Labs (all labs ordered are listed, but only abnormal results are displayed) Labs Reviewed  BASIC METABOLIC PANEL - Abnormal; Notable for the following components:      Result Value   BUN <5 (*)    All other components within normal limits  CBC WITH DIFFERENTIAL/PLATELET - Abnormal; Notable for the following components:   Monocytes Absolute 1.1 (*)    All other components within normal limits  URINALYSIS, ROUTINE W REFLEX MICROSCOPIC - Abnormal; Notable for the following components:   Color, Urine STRAW (*)    Specific Gravity, Urine 1.002 (*)    All other components within normal limits  POC URINE PREG, ED    EKG  EKG Interpretation None       Radiology No results found.  Procedures Procedures (including critical care time)  Medications Ordered in  ED Medications  metoCLOPramide (REGLAN) injection 10 mg (10 mg Intravenous Given 03/12/17 0448)  ketorolac (TORADOL) 30 MG/ML injection 30 mg (30 mg Intravenous Given 03/12/17 0447)     Initial Impression / Assessment and Plan / ED Course  I have reviewed the triage vital signs and the nursing notes.  Pertinent labs   results that were available during my care of the patient were reviewed by me and considered in my medical decision making (see chart for details).     4:42 AM Reassured patient she does not likely have any parasites Also - unlikely that she has meningitis/encephalitis No signs of zoster ophthalmicus Will treat HA and reassess 6:04 AM Pt stable Resting comfortably No distress She feels improved No signs of acute infection/neurologic emergency as cause of headache Will d/c home PCP followup encouraged Final Clinical Impressions(s) / ED Diagnoses   Final diagnoses:  Other headache syndrome    ED Discharge Orders  Ordered    Docosanol 10 % CREA  3 times daily PRN     03/12/17 0602       Zadie Rhine, MD 03/12/17 [phone]

## 2017-03-12 NOTE — ED Notes (Signed)
Pt's father got very upset w/ RN who explained that we still don't have a room for his daughter to go to.  Pt was yelling in the lobby stating that the 7hr wait was outrageous.  He states that we keep saying it was going to be another hour however RN has been here all night that that was never stated to the pt.

## 2017-03-12 NOTE — Discharge Instructions (Signed)

## 2017-03-12 NOTE — ED Notes (Signed)
Multiple complaints, states she is having burning in her eyes she thinks she has herpes in her eyes c/o pressure in the back of her head that goes over her entire body , c/o herpes on her hands. No redness to her eyes or any lesions seen on her face of hands., onset of sx. 1 week ago
